# Patient Record
Sex: Male | Born: 1975 | Hispanic: No | Marital: Married | Smoking: Former smoker
Health system: Southern US, Community
[De-identification: ages and names within clinical notes are randomized; demographics above are authoritative.]

## PROBLEM LIST (undated history)

## (undated) DIAGNOSIS — I10 Essential (primary) hypertension: Secondary | ICD-10-CM

## (undated) DIAGNOSIS — K219 Gastro-esophageal reflux disease without esophagitis: Secondary | ICD-10-CM

## (undated) DIAGNOSIS — T7840XA Allergy, unspecified, initial encounter: Secondary | ICD-10-CM

## (undated) DIAGNOSIS — Z8619 Personal history of other infectious and parasitic diseases: Secondary | ICD-10-CM

## (undated) HISTORY — DX: Essential (primary) hypertension: I10

## (undated) HISTORY — DX: Gastro-esophageal reflux disease without esophagitis: K21.9

## (undated) HISTORY — DX: Personal history of other infectious and parasitic diseases: Z86.19

## (undated) HISTORY — DX: Allergy, unspecified, initial encounter: T78.40XA

## (undated) HISTORY — PX: TONSILLECTOMY AND ADENOIDECTOMY: SHX28

---

## 2000-07-30 ENCOUNTER — Encounter: Admission: RE | Admit: 2000-07-30 | Discharge: 2000-07-30 | Payer: Self-pay | Admitting: Family Medicine

## 2000-07-30 ENCOUNTER — Encounter: Payer: Self-pay | Admitting: Family Medicine

## 2004-12-05 ENCOUNTER — Emergency Department (HOSPITAL_COMMUNITY): Admission: EM | Admit: 2004-12-05 | Discharge: 2004-12-06 | Payer: Self-pay | Admitting: Emergency Medicine

## 2006-11-14 ENCOUNTER — Encounter: Admission: RE | Admit: 2006-11-14 | Discharge: 2006-11-14 | Payer: Self-pay | Admitting: Gastroenterology

## 2009-04-07 ENCOUNTER — Encounter: Admission: RE | Admit: 2009-04-07 | Discharge: 2009-04-07 | Payer: Self-pay | Admitting: Gastroenterology

## 2009-04-11 ENCOUNTER — Ambulatory Visit (HOSPITAL_COMMUNITY): Admission: RE | Admit: 2009-04-11 | Discharge: 2009-04-11 | Payer: Self-pay | Admitting: Gastroenterology

## 2009-04-13 ENCOUNTER — Encounter: Admission: RE | Admit: 2009-04-13 | Discharge: 2009-04-13 | Payer: Self-pay | Admitting: Gastroenterology

## 2012-03-03 ENCOUNTER — Ambulatory Visit: Payer: Self-pay | Admitting: Internal Medicine

## 2012-03-10 ENCOUNTER — Encounter: Payer: Self-pay | Admitting: Internal Medicine

## 2012-03-10 ENCOUNTER — Ambulatory Visit (INDEPENDENT_AMBULATORY_CARE_PROVIDER_SITE_OTHER): Payer: BC Managed Care – PPO | Admitting: Internal Medicine

## 2012-03-10 VITALS — BP 122/80 | HR 60 | Temp 98.4°F | Ht 72.0 in | Wt 198.0 lb

## 2012-03-10 DIAGNOSIS — K219 Gastro-esophageal reflux disease without esophagitis: Secondary | ICD-10-CM

## 2012-03-10 DIAGNOSIS — Z0000 Encounter for general adult medical examination without abnormal findings: Secondary | ICD-10-CM

## 2012-03-10 DIAGNOSIS — I10 Essential (primary) hypertension: Secondary | ICD-10-CM

## 2012-03-10 DIAGNOSIS — C4491 Basal cell carcinoma of skin, unspecified: Secondary | ICD-10-CM

## 2012-03-10 DIAGNOSIS — T782XXA Anaphylactic shock, unspecified, initial encounter: Secondary | ICD-10-CM

## 2012-03-10 MED ORDER — LISINOPRIL 20 MG PO TABS: 20.0000 mg | ORAL_TABLET | Freq: Every day | ORAL | Status: DC

## 2012-03-10 MED ORDER — PANTOPRAZOLE SODIUM 40 MG PO TBEC: 40.0000 mg | DELAYED_RELEASE_TABLET | Freq: Every day | ORAL | Status: DC

## 2012-03-10 NOTE — Assessment & Plan Note (Signed)
Well-controlled on lisinopril 20 mg. Monitor electrolytes and kidney function. 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 Unclear if ACE inhibitor played  a role. Refer to allergist for further testing.

## 2012-03-10 NOTE — Patient Instructions (Signed)
Please complete the following lab tests within one week (fasting): BMET, CBCD, FLP, LFTs, TSH, High sensitivity CRP - 401.9 Our office will contact you re: referral to allergist

## 2012-03-10 NOTE — Assessment & Plan Note (Signed)
The patient reports anaphylactic reaction to shellfish in November of 2013. He was treated at local emergency room. Patient encouraged to get prescription for EpiPen filled. He was strongly encouraged to avoid shellfish until further testing by allergist.

## 2012-03-10 NOTE — Progress Notes (Signed)
Subjective:    Patient ID: Jonetta Speak, male    DOB: 01-19-1976, 36 y.o.   MRN: [id number]  HPI  36 year old white male with history of hypertension and basal cell carcinoma of the skin to establish. Patient previously followed by Dr. Martha Clan. Patient reports being treated for hypertension starting in 2009. His initial blood pressures were 140s systolic and 100s diastolic. He has been treated with lisinopril 20 mg once daily. He is tolerating this without any adverse effects. He denies any chronic cough or upper throat irritation.  He also has history of intermittent heartburn. He had EGD to 3 years ago. It was negative for H. Pylori. It showed gastritis. He has been taking protonix 40 mg for last 2-3 years. He is interested in getting off proton pump inhibitor.  Patient reports episode of anaphylactic reaction in November. His occurred while he was traveling with his family in [place]. He was at a seafood restaurant and after eating shellfish he had to seek urgent medical care secondary to throat swelling. He was treated and given prescription for EpiPen which he has yet to fill.  He has chronic history of allergic rhinitis but has not been allergic to foods that he is aware of in the past.  Review of Systems  Constitutional: Negative for activity change, appetite change and unexpected weight change.  Eyes: Negative for visual disturbance.  Respiratory: Negative for cough, chest tightness and shortness of breath.   Cardiovascular: Negative for chest pain.  Genitourinary: Negative for difficulty urinating.  Neurological: Negative for headaches.  Gastrointestinal: Negative for abdominal pain, heartburn melena or hematochezia Psych: Negative for depression or anxiety ID: no history of STDs.    Past Medical History  Diagnosis Date  . GERD (gastroesophageal reflux disease)   . History of chickenpox   . Allergy   . Hypertension     History   Social History  . Marital  Status: Married    Spouse Name: N/A    Number of Children: N/A  . Years of Education: N/A   Occupational History  . Not on file.   Social History Main Topics  . Smoking status: Former Games developer  . Smokeless tobacco: Not on file  . Alcohol Use: Yes  . Drug Use: No  . Sexually Active: Not on file   Other Topics Concern  . Not on file   Social History Narrative  . No narrative on file    Past Surgical History  Procedure Date  . Tonsillectomy and adenoidectomy     Family History  Problem Relation Age of Onset  . Hypertension Mother   . Cancer Maternal Aunt     great aunt had kidney cancer  . Cancer Paternal Uncle     great uncle had prostate cancer  . Hypertension Maternal Grandmother   . Diabetes Maternal Grandmother   . Cancer Maternal Grandmother     kidney  . Heart disease Paternal Grandfather     No Known Allergies  Current Outpatient Prescriptions on File Prior to Visit  Medication Sig Dispense Refill  . cetirizine (ZYRTEC) 10 MG tablet Take 10 mg by mouth daily.      Marland Kitchen lisinopril (PRINIVIL,ZESTRIL) 20 MG tablet Take 1 tablet (20 mg total) by mouth daily.  90 tablet  1  . pantoprazole (PROTONIX) 40 MG tablet Take 1 tablet (40 mg total) by mouth daily.  90 tablet  1    BP 122/80  Pulse 60  Temp 98.4 F (36.9 C) (Oral)  Ht 6' (1.829 m)  Wt 198 lb (89.812 kg)  BMI 26.85 kg/m2          Objective:   Physical Exam  Constitutional: He is oriented to person, place, and time. He appears well-developed and well-nourished. No distress.  HENT:  Head: Normocephalic and atraumatic.  Right Ear: External ear normal.  Left Ear: External ear normal.  Neck: Neck supple.  Cardiovascular: Normal rate, regular rhythm and normal heart sounds.   No murmur heard. Pulmonary/Chest: Effort normal and breath sounds normal. He has no wheezes.  Abdominal: Soft. Bowel sounds are normal. There is no rebound.  Genitourinary: Penis normal.       Normal testicular exam   Musculoskeletal: He exhibits no edema.  Lymphadenopathy:    He has no cervical adenopathy.  Neurological: He is alert and oriented to person, place, and time. No cranial nerve deficit.  Skin: Skin is warm and dry. No rash noted.  Psychiatric: He has a normal mood and affect. His behavior is normal.          Assessment & Plan:

## 2012-03-10 NOTE — Assessment & Plan Note (Signed)
Reviewed adult health maintenance protocols.  Patient is up-to-date with adult vaccines. His blood pressure is well-controlled on lisinopril. Obtain screening lipid panel. Patient follows up with his dermatologist every 6 months due to his history of basal cell carcinoma.

## 2012-03-10 NOTE — Assessment & Plan Note (Signed)
Patient has history of chronic GERD. He had EGD to 3 years ago. It showed gastritis. It was negative for H. Pylori. He would like to try get off protonix if possible. Patient advised to try ranitidine 150 mg twice daily for 1 to 2 weeks and then take as needed. If reflux symptoms recur patient understands he'll need to restart proton pump inhibitor.  Antireflux measures reviewed.

## 2012-03-12 ENCOUNTER — Other Ambulatory Visit (INDEPENDENT_AMBULATORY_CARE_PROVIDER_SITE_OTHER): Payer: BC Managed Care – PPO

## 2012-03-12 DIAGNOSIS — Z0000 Encounter for general adult medical examination without abnormal findings: Secondary | ICD-10-CM

## 2012-03-12 LAB — BASIC METABOLIC PANEL
CO2: 25 mEq/L (ref 19–32)
Chloride: 99 mEq/L (ref 96–112)
Potassium: 4.9 mEq/L (ref 3.5–5.1)
Sodium: 133 mEq/L — ABNORMAL LOW (ref 135–145)

## 2012-03-12 LAB — LIPID PANEL
Cholesterol: 180 mg/dL (ref 0–200)
LDL Cholesterol: 117 mg/dL — ABNORMAL HIGH (ref 0–99)
Total CHOL/HDL Ratio: 3

## 2012-03-12 LAB — CBC WITH DIFFERENTIAL/PLATELET
Basophils Relative: 0.4 % (ref 0.0–3.0)
Eosinophils Absolute: 0.1 10*3/uL (ref 0.0–0.7)
Hemoglobin: 15.2 g/dL (ref 13.0–17.0)
MCHC: 34.5 g/dL (ref 30.0–36.0)
MCV: 88.8 fl (ref 78.0–100.0)
Monocytes Absolute: 0.7 10*3/uL (ref 0.1–1.0)
Neutro Abs: 3.4 10*3/uL (ref 1.4–7.7)
RBC: 4.94 Mil/uL (ref 4.22–5.81)

## 2012-03-12 LAB — HEPATIC FUNCTION PANEL
ALT: 38 U/L (ref 0–53)
Albumin: 4.8 g/dL (ref 3.5–5.2)
Alkaline Phosphatase: 60 U/L (ref 39–117)
Total Protein: 8.2 g/dL (ref 6.0–8.3)

## 2012-03-12 LAB — POCT URINALYSIS DIPSTICK
Protein, UA: NEGATIVE
Spec Grav, UA: 1.01
Urobilinogen, UA: 0.2
pH, UA: 7

## 2012-04-17 ENCOUNTER — Telehealth: Payer: Self-pay | Admitting: Internal Medicine

## 2012-04-17 NOTE — Telephone Encounter (Signed)
Awaiting callback from pt.  

## 2012-08-28 ENCOUNTER — Other Ambulatory Visit: Payer: Self-pay | Admitting: Internal Medicine

## 2012-09-08 ENCOUNTER — Ambulatory Visit: Payer: BC Managed Care – PPO | Admitting: Internal Medicine

## 2012-09-08 DIAGNOSIS — Z0289 Encounter for other administrative examinations: Secondary | ICD-10-CM

## 2012-09-21 ENCOUNTER — Other Ambulatory Visit: Payer: Self-pay | Admitting: Internal Medicine

## 2012-11-24 ENCOUNTER — Other Ambulatory Visit: Payer: Self-pay | Admitting: Internal Medicine

## 2012-12-15 ENCOUNTER — Other Ambulatory Visit: Payer: Self-pay | Admitting: Internal Medicine

## 2013-02-22 ENCOUNTER — Other Ambulatory Visit: Payer: Self-pay | Admitting: Internal Medicine

## 2013-03-23 ENCOUNTER — Telehealth: Payer: Self-pay | Admitting: Internal Medicine

## 2013-03-23 NOTE — Telephone Encounter (Signed)
Dr Artist Pais is not in the office this week.  Pt will need to be seen by another physician

## 2013-03-23 NOTE — Telephone Encounter (Signed)
Pt decline appt, pt stated if he develop symptoms he will call.

## 2013-03-23 NOTE — Telephone Encounter (Signed)
Pt wife has been confirm with flu her doctor is at [facility]. Pt is having cough and chest congestion and requesting tami-flu call into [facility] [address]. Pt last seen dec 2013.

## 2013-04-08 ENCOUNTER — Ambulatory Visit (INDEPENDENT_AMBULATORY_CARE_PROVIDER_SITE_OTHER): Payer: BC Managed Care – PPO | Admitting: Internal Medicine

## 2013-04-08 ENCOUNTER — Encounter: Payer: Self-pay | Admitting: Internal Medicine

## 2013-04-08 DIAGNOSIS — A879 Viral meningitis, unspecified: Secondary | ICD-10-CM

## 2013-04-08 DIAGNOSIS — G030 Nonpyogenic meningitis: Secondary | ICD-10-CM

## 2013-04-08 NOTE — Progress Notes (Signed)
Subjective:    Patient ID: Daniel Mata, male    DOB: 11/06/1975, 38 y.o.   MRN: [id number]  HPI Daniel Mata woke up Saturday with a sore neck, weakness, dizziness. Mild chest pain - sharp not crushing. He has continued nausea, neck pain - stiff with HA global. Mild paresthesia, no photophobia, + nausea w/o vomiting. No cough but mild congestions. Has swollen gland right and "canker sore" gum. Wife did have confirmed influenza but did not take tamiflu.  Past Medical History  Diagnosis Date  . GERD (gastroesophageal reflux disease)   . History of chickenpox   . Allergy   . Hypertension    Past Surgical History  Procedure Laterality Date  . Tonsillectomy and adenoidectomy     Family History  Problem Relation Age of Onset  . Hypertension Mother   . Cancer Maternal Aunt     great aunt had kidney cancer  . Cancer Paternal Uncle     great uncle had prostate cancer  . Hypertension Maternal Grandmother   . Diabetes Maternal Grandmother   . Cancer Maternal Grandmother     kidney  . Heart disease Paternal Grandfather    History   Social History  . Marital Status: Married    Spouse Name: N/A    Number of Children: N/A  . Years of Education: N/A   Occupational History  . Not on file.   Social History Main Topics  . Smoking status: Former Research scientist (life sciences)  . Smokeless tobacco: Not on file  . Alcohol Use: Yes  . Drug Use: No  . Sexual Activity: Not on file   Other Topics Concern  . Not on file   Social History Narrative  . No narrative on file    Current Outpatient Prescriptions on File Prior to Visit  Medication Sig Dispense Refill  . cetirizine (ZYRTEC) 10 MG tablet Take 10 mg by mouth daily.      Marland Kitchen lisinopril (PRINIVIL,ZESTRIL) 20 MG tablet TAKE 1 TABLET (20 MG TOTAL) BY MOUTH DAILY.  90 tablet  0  . pantoprazole (PROTONIX) 40 MG tablet TAKE 1 TABLET (40 MG TOTAL) BY MOUTH DAILY.  60 tablet  3   No current facility-administered medications on file prior to visit.      Review of Systems System review is negative for any constitutional, cardiac, pulmonary, GI or neuro symptoms or complaints other than as described in the HPI.     Objective:   Physical Exam T 98.2, 140/100, HR 58, O2 sat 97%  Wt 201 Gen';l- WNWD man in no distress HEENT - enlarged left pupil, normal reaction to light, throat - apthous ulcer right tonsilar fossa otherwise normal Neck - mild stiffness with flexion, negative Kernig's Cor- RRR pulm - CTAP Neuro - A&O x 3, speech clear, cognition normal; CN II-XII - normal facial symmetry and movement, anisicoria with dilated left pupil that is normally reactive, no visual field cut; normal strength no focal changes; DTRs 2+ symmetrical; MS 5/5 equal throughout; cerebellar - negative for pronator drift, normal Rhomberg, normal gait and tandem gait.       Assessment & Plan:  Aseptic vs viral meningitis - very mild. Can result from any viral infection. No signs of bacterial infection. Neurologic exam is normal except for enlarged left pupil.   Plan  Tylenol 1,000 mg three times a day for pain or fever  Hydrate  For unrelieved headache - aleve or ibuprofen  Watch for increased fever, progressive pain in the neck or stiffness, increased  HA, loss of focus and concentration that is getting worse, projectile vomiting.

## 2013-04-08 NOTE — Progress Notes (Signed)
Pre visit review using our clinic review tool, if applicable. No additional management support is needed unless otherwise documented below in the visit note. 

## 2013-04-08 NOTE — Patient Instructions (Signed)
Aseptic vs viral meningitis - very mild. Can result from any viral infection. No signs of bacterial infection. Neurologic exam is normal except for enlarged left pupil.   Plan  Tylenol 1,000 mg three times a day for pain or fever  Hydrate  For unrelieved headache - aleve or ibuprofen  Watch for increased fever, progressive pain in the neck or stiffness, increased HA, loss of focus and concentration that is getting worse, projectile vomiting.   Viral Meningitis Meningitis is an inflammation of the fluid and membranes surrounding the spinal cord and brain. Viral meningitis is caused by a viral infection. It is important to know whether a virus or bacterium is causing your meningitis. Viral meningitis is generally less severe than bacterial meningitis. Aseptic meningitis is any meningitis not caused by a bacterial infection, including viral meningitis. Meningitis can also be caused by fungi, parasites, certain medicines, cancer, and autoimmune disorders. Uncomplicated meningitis usually resolves on its own in 7 to 10 days.However, there can be complicated cases that last much longer. People with lowered immune systems are at greater risk for poor outcomes from meningitis.It is important to get treatment as soon as possible to minimize the impact of a meningitis infection. Long-term complications could include seizures, hearing loss, weakness, paralysis, blindness, or cognitive impairment. CAUSES Most cases of meningitis are due to viruses. Viruses that cause meningitis include enteroviruses (such as polio and coxsackie viruses), herpes simplex virus, varicella zoster virus, mumps, and HIV. SYMPTOMS  Symptoms can develop over many hours. They may even take a few days to develop. Common symptoms of meningitis in people over the age of 2 years include:  High fever.  Headache.  Stiff neck.  Irritability.  Nausea and vomiting.  Fatigue.  Discomfort from exposure to light.  Discomfort from  exposure to loud noise.  Trouble walking.  Altered mental status.  Seizures. DIAGNOSIS  Early diagnosis and treatment are very important. If you have symptoms, you should see your caregiver right away. Your evaluation may include lab tests of your blood and spinal fluid. A spinal fluid sample is taken through a procedure called a lumbar puncture or spinal tap. During the procedure, a needle is inserted into an area in the lower back. You may also have a CT scan of your brain as part of your evaluation.If there is suspicion of an infection or inflammation of the brain (encephalitis), an MRI scan may be done. TREATMENT   Antibiotics are not effective against a virus. Antiviral medicines may be used depending on the cause of your meningitis.  Treatment for viral meningitis focuses on reducing your symptoms. This may include rest, hydration, and medicines to reduce fever and pain.  Steroids may also be used if there is significant swelling of the brain. PREVENTION  Vaccines can help prevent meningitis caused by polio, measles, and mumps.  Strict hand washing is effective in controlling the spread of many causes of meningitis.  Using barrier protection during sexual intercourse can prevent the spread of meningitis caused by viruses such as the herpes virus or HIV.  Protection from mosquitoes, especially for the very young and very old, can prevent some causes of meningitis. HOME CARE INSTRUCTIONS  Rest.  Drink enough water and fluids to keep your urine clear or pale yellow.  Take all medicine as prescribed.  Practice good hygiene to prevent others from getting sick.  Follow up with your caregiver as directed. SEEK IMMEDIATE MEDICAL CARE IF:  You develop dizziness.  You have a very fast heartbeat.  You have difficulty breathing.  You develop confusion.  You have seizures.  You have unstoppable nausea and vomiting.  You develop any worsening symptoms. MAKE SURE  YOU:  Understand these instructions.  Will watch your condition.  Will get help right away if you are not doing well or get worse. Document Released: 03/08/2002 Document Revised: 06/10/2011 Document Reviewed: 07/03/2010 [facility] Patient Information 2014 [place].

## 2013-04-09 ENCOUNTER — Ambulatory Visit: Payer: BC Managed Care – PPO

## 2013-04-12 ENCOUNTER — Telehealth: Payer: Self-pay | Admitting: *Deleted

## 2013-04-12 NOTE — Telephone Encounter (Signed)
Per Dr Shawna Orleans he could see pt on Wednesday but if he develops a high fever pt needs to go to ER for a lumbar puncture to r/o bacterial meningitis.  Pt is going to be out of town on Wednesday so he wanted to see Dr Linda Hedges tomorrow.  Dr Linda Hedges is booked up tomorrow.  So he is ok with seeing Dr Raliegh Ip at 11 tomorrow 04/13/13.  Pt understands that he needs to go to the ER if he develops high fever.

## 2013-04-12 NOTE — Telephone Encounter (Signed)
Pt calling to state he was referred to see PCP, if not feeling better.  Diagnosed with aseptic meningitis on 04/08/13 and not feeling better.  Would like to know if he can be worked in with PCP ASAP or if PCP can recommend treatment options since he is no better.

## 2013-04-12 NOTE — Telephone Encounter (Signed)
If he is seeing continued improvement and reduction in symptoms he doesn't need to be seen. If there is no improvement he will need OV.

## 2013-04-12 NOTE — Telephone Encounter (Signed)
Detailed message left on patient's voicemail. The voicemail did state his name.

## 2013-04-12 NOTE — Telephone Encounter (Signed)
Pat phoned, saw you on the 8th with a dx of aseptic meningitis.  States that he's 10 days out with sxs and remains symptomatic.  Does he need to come in for f/u or continue to wait it out.  Please advise.  Felt well enough to attempt to go to work (at work at this time.)   CB# [phone]

## 2013-04-13 ENCOUNTER — Encounter: Payer: Self-pay | Admitting: Internal Medicine

## 2013-04-13 ENCOUNTER — Ambulatory Visit (INDEPENDENT_AMBULATORY_CARE_PROVIDER_SITE_OTHER): Payer: BC Managed Care – PPO | Admitting: Internal Medicine

## 2013-04-13 VITALS — BP 130/90 | HR 63 | Temp 98.0°F | Resp 18 | Ht 72.0 in | Wt 204.0 lb

## 2013-04-13 DIAGNOSIS — B9789 Other viral agents as the cause of diseases classified elsewhere: Secondary | ICD-10-CM

## 2013-04-13 DIAGNOSIS — B349 Viral infection, unspecified: Secondary | ICD-10-CM

## 2013-04-13 NOTE — Patient Instructions (Signed)
Call or return to clinic prn if these symptoms worsen or fail to improve as anticipated.

## 2013-04-13 NOTE — Progress Notes (Signed)
Subjective:    Patient ID: Daniel Mata, male    DOB: 1975-10-12, 38 y.o.   MRN: [id number]  HPI  38 year old patient who is seen today in followup. The patient has been ill since January 3 with mild dizziness neck discomfort and general sense of unwellness. He has had some mild dizziness and has felt generally unwell. He was seen last week and felt to have mild aseptic meningitis. Over the past 5 days he has improved slightly but still with some fatigue and mild dizziness. He has taken no medications  Past Medical History  Diagnosis Date  . GERD (gastroesophageal reflux disease)   . History of chickenpox   . Allergy   . Hypertension     History   Social History  . Marital Status: Married    Spouse Name: N/A    Number of Children: N/A  . Years of Education: N/A   Occupational History  . Not on file.   Social History Main Topics  . Smoking status: Former Research scientist (life sciences)  . Smokeless tobacco: Not on file  . Alcohol Use: Yes  . Drug Use: No  . Sexual Activity: Not on file   Other Topics Concern  . Not on file   Social History Narrative  . No narrative on file    Past Surgical History  Procedure Laterality Date  . Tonsillectomy and adenoidectomy      Family History  Problem Relation Age of Onset  . Hypertension Mother   . Cancer Maternal Aunt     great aunt had kidney cancer  . Cancer Paternal Uncle     great uncle had prostate cancer  . Hypertension Maternal Grandmother   . Diabetes Maternal Grandmother   . Cancer Maternal Grandmother     kidney  . Heart disease Paternal Grandfather     Allergies  Allergen Reactions  . Histamine     Related to spice that may have been in the crab boil    Current Outpatient Prescriptions on File Prior to Visit  Medication Sig Dispense Refill  . cetirizine (ZYRTEC) 10 MG tablet Take 10 mg by mouth daily.      Marland Kitchen lisinopril (PRINIVIL,ZESTRIL) 20 MG tablet TAKE 1 TABLET (20 MG TOTAL) BY MOUTH DAILY.  90 tablet  0  . pantoprazole  (PROTONIX) 40 MG tablet TAKE 1 TABLET (40 MG TOTAL) BY MOUTH DAILY.  60 tablet  3   No current facility-administered medications on file prior to visit.    BP 130/90  Pulse 63  Temp(Src) 98 F (36.7 C) (Oral)  Resp 18  Ht 6' (1.829 m)  Wt 204 lb (92.534 kg)  BMI 27.66 kg/m2  SpO2 97%       Review of Systems  Constitutional: Positive for activity change and fatigue. Negative for fever, chills and appetite change.  HENT: Negative for congestion, dental problem, ear pain, hearing loss, sore throat, tinnitus, trouble swallowing and voice change.   Eyes: Negative for pain, discharge and visual disturbance.  Respiratory: Negative for cough, chest tightness, wheezing and stridor.   Cardiovascular: Negative for chest pain, palpitations and leg swelling.  Gastrointestinal: Negative for nausea, vomiting, abdominal pain, diarrhea, constipation, blood in stool and abdominal distention.  Genitourinary: Negative for urgency, hematuria, flank pain, discharge, difficulty urinating and genital sores.  Musculoskeletal: Negative for arthralgias, back pain, gait problem, joint swelling, myalgias and neck stiffness.  Skin: Negative for rash.  Neurological: Positive for light-headedness. Negative for dizziness, syncope, speech difficulty, weakness, numbness and headaches.  Hematological: Negative for adenopathy. Does not bruise/bleed easily.  Psychiatric/Behavioral: Negative for behavioral problems and dysphoric mood. The patient is not nervous/anxious.        Objective:   Physical Exam  Constitutional: He is oriented to person, place, and time. He appears well-developed.  HENT:  Head: Normocephalic.  Right Ear: External ear normal.  Left Ear: External ear normal.  Aphthous ulceration has resolved  Eyes: Conjunctivae and EOM are normal. Pupils are equal, round, and reactive to light.  Pupillary Responses are normal. Mid position and briskly reactive. No anisocoria  Neck: Normal range of  motion.  Cervical adenopathy has resolved  No meningismus  Cardiovascular: Normal rate and normal heart sounds.   Pulmonary/Chest: Breath sounds normal.  Abdominal: Bowel sounds are normal.  Musculoskeletal: Normal range of motion. He exhibits no edema and no tenderness.  Neurological: He is alert and oriented to person, place, and time.  Psychiatric: He has a normal mood and affect. His behavior is normal.          Assessment & Plan:   Probable mild viral syndrome.  Clinical exam is unremarkable without meningismus. Will treat symptomatically and observe

## 2013-04-13 NOTE — Progress Notes (Signed)
Pre-visit discussion using our clinic review tool. No additional management support is needed unless otherwise documented below in the visit note.  

## 2013-05-07 ENCOUNTER — Other Ambulatory Visit: Payer: BC Managed Care – PPO

## 2013-05-12 ENCOUNTER — Telehealth: Payer: Self-pay | Admitting: Internal Medicine

## 2013-05-12 ENCOUNTER — Other Ambulatory Visit: Payer: BC Managed Care – PPO

## 2013-05-12 NOTE — Telephone Encounter (Signed)
Pt states he has not felt well since Jan 1. would like to see dr Daniel Mata asap.  No appt this week. Pt would like to be worked in, or should he see someone else. Pt prefers dr Daniel Mata. pls advise. Pt has had several issues lately, seen by dr Linda Hedges and dr Raliegh Ip

## 2013-05-12 NOTE — Telephone Encounter (Signed)
Pt following up on appt request. pls advise.

## 2013-05-13 NOTE — Telephone Encounter (Addendum)
Pt will be here at 4:15 pm on Friday!  (Even though sch states 1pm) Thanks!!

## 2013-05-13 NOTE — Telephone Encounter (Signed)
Try to work him in either early or late afternoon Friday.

## 2013-05-14 ENCOUNTER — Ambulatory Visit (INDEPENDENT_AMBULATORY_CARE_PROVIDER_SITE_OTHER): Payer: BC Managed Care – PPO | Admitting: Internal Medicine

## 2013-05-14 ENCOUNTER — Ambulatory Visit: Payer: BC Managed Care – PPO | Admitting: Internal Medicine

## 2013-05-14 ENCOUNTER — Encounter: Payer: BC Managed Care – PPO | Admitting: Internal Medicine

## 2013-05-14 ENCOUNTER — Encounter: Payer: Self-pay | Admitting: Internal Medicine

## 2013-05-14 VITALS — BP 132/90 | Temp 97.9°F | Ht 72.0 in | Wt 202.0 lb

## 2013-05-14 DIAGNOSIS — B349 Viral infection, unspecified: Secondary | ICD-10-CM

## 2013-05-14 DIAGNOSIS — R079 Chest pain, unspecified: Secondary | ICD-10-CM

## 2013-05-14 DIAGNOSIS — B9789 Other viral agents as the cause of diseases classified elsewhere: Secondary | ICD-10-CM

## 2013-05-14 DIAGNOSIS — R519 Headache, unspecified: Secondary | ICD-10-CM | POA: Insufficient documentation

## 2013-05-14 DIAGNOSIS — R5381 Other malaise: Secondary | ICD-10-CM

## 2013-05-14 DIAGNOSIS — R5383 Other fatigue: Secondary | ICD-10-CM

## 2013-05-14 DIAGNOSIS — R51 Headache: Secondary | ICD-10-CM

## 2013-05-14 DIAGNOSIS — I10 Essential (primary) hypertension: Secondary | ICD-10-CM

## 2013-05-14 MED ORDER — VALSARTAN 160 MG PO TABS: 160.0000 mg | ORAL_TABLET | Freq: Every day | ORAL | Status: DC

## 2013-05-14 MED ORDER — METHOCARBAMOL 500 MG PO TABS: 500.0000 mg | ORAL_TABLET | Freq: Three times a day (TID) | ORAL | Status: DC | PRN

## 2013-05-14 NOTE — Assessment & Plan Note (Addendum)
He has questionable hx of allergy to shrimp.  Discontinue ACE inhibitor. Switch to valsartan 160 mg once daily.  Patient advised to monitor BP at home.

## 2013-05-14 NOTE — Assessment & Plan Note (Signed)
Patient experiencing persistent headache that is localized to his occipital area. I doubt his persistent headache related to his initial viral illness. He does not have neck stiffness on exam. His MRI and MRA of brain were unremarkable.  MRI of C-spine also normal. I suspect his persistent discomfort secondary to somatic dysfunction of upper cervical vertebrae. Utilized myofascial release and HVLA techniques to thoracic and cervical spine.  Patient tolerated well without any complications. Patient advised to use ibuprofen 400 mg once daily for one week. Patient also given prescription for Robaxin 500 mg to use 3 times a day as needed.  Patient advised to call office if symptoms persist or worsen.  Reassess in 6 weeks.

## 2013-05-14 NOTE — Progress Notes (Signed)
Pre visit review using our clinic review tool, if applicable. No additional management support is needed unless otherwise documented below in the visit note. 

## 2013-05-14 NOTE — Patient Instructions (Addendum)
Please contact our office if your symptoms do not improve or gets worse. Please complete the following lab tests before your next follow up appointment: BMET - 401.9

## 2013-05-14 NOTE — Progress Notes (Signed)
Subjective:    Patient ID: Daniel Mata, male    DOB: Nov 08, 1975, 38 y.o.   MRN: [id number]  HPI  38 year old white male with history of hypertension for followup. Interval medical history-Patient was seen on 04/08/2013 with complaints of flulike symptoms, sore neck and weakness by Dr. Linda Hedges.  His symptoms were attributed to viral syndrome with possible aseptic meningitis. Patient was then seen on January 13th by Dr. Burnice Logan for persistent neck pain and fatigue.  Patient presumed to have resolving viral syndrome.  Patient reports his symptoms got worse and he was seen at local emergency room in [facility]). Patient had extensive workup including MRI and MRA of brain as well as of cervical spine. Outside medical records reviewed-MRI of C-spine showed mild reversal of normal cervical lordosis otherwise unremarkable study. Patient's MRI and MRA were also negative. He tested negative for influenza A and B. CBC was normal without neutrophilia or neutropenia. Patient's complete metabolic panel is essentially normal. Patient was tested for Lyme disease with ELIZA and Western blot. Patient had one band that was positive on Western blot.  He then reports she was seen by neurologist - Dr. Joretta Bachelor.  She felt her symptoms may have been secondary to tension/migraine and he was given course of prednisone. Patient reports no improvement in symptoms. He did not want to proceed with additional medications for migraine.  Patient reports there are days when he feels normal. When he is symptomatic, by late morning he starts to experience posterior neck pain and sensation of "something sitting on top of his head". There is no associated fever or chills. He denies any photophobia or sonophobia. He denies any nausea.  He also still feels "run down" as if still fighting viral infection.  Patient reports his wife had flulike symptoms around Christmas of 2014. She traveled to [place], [place] for one night  before her symptoms started. He denies any insect bites or unusual food intake.  He denies recent tick exposure. No unusual rash.    Review of Systems  Negative for joint pains, negative for abdominal symptoms, he is sleeping normally, negative for light sensitivity, no change in appetite.   Patient denies exercise intolerance. No unusual shortness of breath, or cough.  Patient reports he had left lateral chest pain when he first became ill in early January. No persistent chest symptoms.  Negative for orthopnea     Past Medical History  Diagnosis Date  . GERD (gastroesophageal reflux disease)   . History of chickenpox   . Allergy   . Hypertension     History   Social History  . Marital Status: Married    Spouse Name: N/A    Number of Children: N/A  . Years of Education: N/A   Occupational History  . Not on file.   Social History Main Topics  . Smoking status: Former Research scientist (life sciences)  . Smokeless tobacco: Not on file  . Alcohol Use: Yes  . Drug Use: No  . Sexual Activity: Not on file   Other Topics Concern  . Not on file   Social History Narrative  . No narrative on file    Past Surgical History  Procedure Laterality Date  . Tonsillectomy and adenoidectomy      Family History  Problem Relation Age of Onset  . Hypertension Mother   . Cancer Maternal Aunt     great aunt had kidney cancer  . Cancer Paternal Uncle     great uncle had prostate cancer  .  Hypertension Maternal Grandmother   . Diabetes Maternal Grandmother   . Cancer Maternal Grandmother     kidney  . Heart disease Paternal Grandfather     Allergies  Allergen Reactions  . Histamine     Related to spice that may have been in the crab boil    Current Outpatient Prescriptions on File Prior to Visit  Medication Sig Dispense Refill  . ALOE PO Take 2 capsules by mouth 2 (two) times daily.      . Ascorbic Acid (VITAMIN C) 1000 MG tablet Take 1,000 mg by mouth daily.      . cetirizine (ZYRTEC) 10 MG tablet  Take 10 mg by mouth daily.      . pantoprazole (PROTONIX) 40 MG tablet TAKE 1 TABLET (40 MG TOTAL) BY MOUTH DAILY.  60 tablet  3   No current facility-administered medications on file prior to visit.    BP 132/90  Temp(Src) 97.9 F (36.6 C) (Oral)  Ht 6' (1.829 m)  Wt 202 lb (91.627 kg)  BMI 27.39 kg/m2  EKG showes sinus bradycardia at 53 beats per minute. He has nonspecific QRS widening. When compared to previous EKG-no significant changes.  Objective:   Physical Exam  Constitutional: He is oriented to person, place, and time. He appears well-developed and well-nourished. No distress.  HENT:  Head: Normocephalic and atraumatic.  Right Ear: External ear normal.  Left Ear: External ear normal.  Mouth/Throat: Oropharynx is clear and moist.  Eyes: EOM are normal. Pupils are equal, round, and reactive to light.  Neck: Neck supple.  Cardiovascular: Normal rate, regular rhythm and normal heart sounds.   Pulmonary/Chest: Effort normal and breath sounds normal. He has no wheezes.  Abdominal: Soft. Bowel sounds are normal. He exhibits no mass. There is no tenderness.  No organomegaly  Musculoskeletal:  No neck stiffness, good ROM.  Mild tenderness near occipital area  Lymphadenopathy:    He has no cervical adenopathy.  Neurological: He is alert and oriented to person, place, and time. He displays normal reflexes. No cranial nerve deficit. He exhibits normal muscle tone. Coordination normal.  Muscle strength is 5 out of 5 throughout  Skin: Skin is warm and dry. No rash noted.  Psychiatric: He has a normal mood and affect. His behavior is normal.          Assessment & Plan:

## 2013-05-14 NOTE — Assessment & Plan Note (Signed)
Patient complains of ongoing fatigue after initial viral illness. I doubt he has Lyme's disease. His Western blot was nonspecific. Check Epstein-Barr and CMV titer.  Check Ehrlichia antibody panel.  Low suspicion for tick borne disease.  He likely has non specific resolving viral syndrome.  I recommended supportive care.

## 2013-05-15 LAB — CMV ABS, IGG+IGM (CYTOMEGALOVIRUS): CYTOMEGALOVIRUS AB-IGG: 6.9 U/mL — AB (ref <0.60)

## 2013-05-15 LAB — EPSTEIN-BARR VIRUS VCA ANTIBODY PANEL
EBV EA IgG: <5 U/mL (ref <9.0)
EBV NA IGG: 374 U/mL — AB (ref <18.0)
EBV VCA IGG: 145 U/mL — AB (ref <18.0)

## 2013-05-17 LAB — EHRLICHIA ANTIBODY PANEL
E chaffeensis (HGE) Ab, IgG: <1:64 {titer}
E chaffeensis (HGE) Ab, IgM: <1:20 {titer}

## 2013-05-18 ENCOUNTER — Other Ambulatory Visit: Payer: Self-pay | Admitting: Internal Medicine

## 2013-06-03 ENCOUNTER — Other Ambulatory Visit: Payer: BC Managed Care – PPO

## 2013-06-07 ENCOUNTER — Encounter: Payer: BC Managed Care – PPO | Admitting: Internal Medicine

## 2013-06-17 ENCOUNTER — Other Ambulatory Visit: Payer: BC Managed Care – PPO

## 2013-06-20 ENCOUNTER — Other Ambulatory Visit: Payer: Self-pay | Admitting: Internal Medicine

## 2013-06-25 ENCOUNTER — Ambulatory Visit: Payer: BC Managed Care – PPO | Admitting: Internal Medicine

## 2013-07-12 ENCOUNTER — Encounter: Payer: Self-pay | Admitting: Internal Medicine

## 2013-07-12 DIAGNOSIS — R5381 Other malaise: Secondary | ICD-10-CM

## 2013-07-12 DIAGNOSIS — R5383 Other fatigue: Principal | ICD-10-CM

## 2013-07-16 ENCOUNTER — Ambulatory Visit: Payer: BC Managed Care – PPO

## 2013-07-16 ENCOUNTER — Other Ambulatory Visit (INDEPENDENT_AMBULATORY_CARE_PROVIDER_SITE_OTHER): Payer: BC Managed Care – PPO

## 2013-07-16 DIAGNOSIS — R5381 Other malaise: Secondary | ICD-10-CM

## 2013-07-16 DIAGNOSIS — R5383 Other fatigue: Principal | ICD-10-CM

## 2013-07-16 LAB — TESTOSTERONE: Testosterone: 258.37 ng/dL — ABNORMAL LOW (ref 350.00–890.00)

## 2013-07-16 LAB — T4, FREE: Free T4: 0.9 ng/dL (ref 0.60–1.60)

## 2013-07-16 LAB — TSH: TSH: 3.18 u[IU]/mL (ref 0.35–5.50)

## 2013-07-16 LAB — VITAMIN B12: Vitamin B-12: 410 pg/mL (ref 211–911)

## 2013-07-19 LAB — HEPATIC FUNCTION PANEL
ALBUMIN: 4.5 g/dL (ref 3.5–5.2)
ALK PHOS: 62 U/L (ref 39–117)
ALT: 32 U/L (ref 0–53)
AST: 36 U/L (ref 0–37)
Bilirubin, Direct: 0.1 mg/dL (ref 0.0–0.3)
Total Bilirubin: 0.7 mg/dL (ref 0.3–1.2)
Total Protein: 7.7 g/dL (ref 6.0–8.3)

## 2013-07-19 LAB — TESTOSTERONE, FREE, TOTAL, SHBG
SEX HORMONE BINDING: 26 nmol/L (ref 13–71)
TESTOSTERONE FREE: 57.1 pg/mL (ref 47.0–244.0)
TESTOSTERONE-% FREE: 2.2 % (ref 1.6–2.9)
TESTOSTERONE: 258 ng/dL — AB (ref 300–890)

## 2013-07-19 LAB — LIPID PANEL
CHOLESTEROL: 171 mg/dL (ref 0–200)
HDL: 54.8 mg/dL (ref >39.00)
LDL Cholesterol: 89 mg/dL (ref 0–99)
Total CHOL/HDL Ratio: 3
Triglycerides: 134 mg/dL (ref 0.0–149.0)
VLDL: 26.8 mg/dL (ref 0.0–40.0)

## 2013-07-19 LAB — T3, FREE: T3, Free: 3.1 pg/mL (ref 2.3–4.2)

## 2013-07-22 LAB — METHYLMALONIC ACID, SERUM: Methylmalonic Acid, Quant: 86 nmol/L — ABNORMAL LOW (ref 87–318)

## 2013-07-28 ENCOUNTER — Encounter: Payer: Self-pay | Admitting: Internal Medicine

## 2013-08-16 ENCOUNTER — Other Ambulatory Visit: Payer: Self-pay | Admitting: Internal Medicine

## 2013-11-09 ENCOUNTER — Encounter: Payer: Self-pay | Admitting: Internal Medicine

## 2013-11-09 DIAGNOSIS — Z0182 Encounter for allergy testing: Secondary | ICD-10-CM

## 2013-11-10 NOTE — Telephone Encounter (Signed)
I can do Food allergy blood tests for common food groups, including alpha-gal for meats. I don't associate neck and back of head discomforts with allergy,  But I can see him if that would help you.

## 2013-11-12 NOTE — Addendum Note (Signed)
Addended by: Townsend Roger D on: 11/12/2013 09:32 AM   Modules accepted: Orders

## 2013-12-14 ENCOUNTER — Telehealth: Payer: Self-pay | Admitting: Internal Medicine

## 2013-12-14 NOTE — Telephone Encounter (Signed)
Dr. Annamaria Boots is pulmonary physician as well as allergist.  If he wants to go to another allergist, I am willing.

## 2013-12-14 NOTE — Telephone Encounter (Signed)
Pt states he thought he is going for food allergy testing, and wonders why he is referred/scheduled w/ pulmonary dr.?

## 2013-12-14 NOTE — Telephone Encounter (Signed)
Pt aware, nothing further needed.  ?

## 2013-12-23 ENCOUNTER — Institutional Professional Consult (permissible substitution): Payer: BC Managed Care – PPO | Admitting: Internal Medicine

## 2014-02-17 ENCOUNTER — Institutional Professional Consult (permissible substitution): Payer: BC Managed Care – PPO | Admitting: Internal Medicine

## 2014-04-14 ENCOUNTER — Emergency Department (HOSPITAL_COMMUNITY)
Admission: EM | Admit: 2014-04-14 | Discharge: 2014-04-15 | Disposition: A | Discharge status: Home / Self Care | Payer: BLUE CROSS/BLUE SHIELD | Attending: Emergency Medicine | Admitting: Emergency Medicine

## 2014-04-14 ENCOUNTER — Encounter (HOSPITAL_COMMUNITY): Payer: Self-pay | Admitting: Emergency Medicine

## 2014-04-14 ENCOUNTER — Emergency Department (HOSPITAL_COMMUNITY): Payer: BLUE CROSS/BLUE SHIELD

## 2014-04-14 DIAGNOSIS — R110 Nausea: Secondary | ICD-10-CM | POA: Diagnosis not present

## 2014-04-14 DIAGNOSIS — Z87891 Personal history of nicotine dependence: Secondary | ICD-10-CM | POA: Insufficient documentation

## 2014-04-14 DIAGNOSIS — Z79899 Other long term (current) drug therapy: Secondary | ICD-10-CM | POA: Insufficient documentation

## 2014-04-14 DIAGNOSIS — R0789 Other chest pain: Secondary | ICD-10-CM | POA: Insufficient documentation

## 2014-04-14 DIAGNOSIS — Z8709 Personal history of other diseases of the respiratory system: Secondary | ICD-10-CM | POA: Diagnosis not present

## 2014-04-14 DIAGNOSIS — Z8719 Personal history of other diseases of the digestive system: Secondary | ICD-10-CM | POA: Diagnosis not present

## 2014-04-14 DIAGNOSIS — R42 Dizziness and giddiness: Secondary | ICD-10-CM | POA: Diagnosis not present

## 2014-04-14 DIAGNOSIS — I10 Essential (primary) hypertension: Secondary | ICD-10-CM | POA: Insufficient documentation

## 2014-04-14 DIAGNOSIS — R079 Chest pain, unspecified: Secondary | ICD-10-CM | POA: Diagnosis present

## 2014-04-14 DIAGNOSIS — Z8619 Personal history of other infectious and parasitic diseases: Secondary | ICD-10-CM | POA: Insufficient documentation

## 2014-04-14 LAB — CBC
HCT: 40.7 % (ref 39.0–52.0)
Hemoglobin: 14.5 g/dL (ref 13.0–17.0)
MCH: 30.7 pg (ref 26.0–34.0)
MCHC: 35.6 g/dL (ref 30.0–36.0)
MCV: 86.2 fL (ref 78.0–100.0)
PLATELETS: 230 10*3/uL (ref 150–400)
RBC: 4.72 MIL/uL (ref 4.22–5.81)
RDW: 11.4 % — AB (ref 11.5–15.5)
WBC: 11.9 10*3/uL — ABNORMAL HIGH (ref 4.0–10.5)

## 2014-04-14 LAB — I-STAT TROPONIN, ED
Troponin i, poc: 0 ng/mL (ref 0.00–0.08)
Troponin i, poc: 0 ng/mL (ref 0.00–0.08)

## 2014-04-14 LAB — BASIC METABOLIC PANEL
Anion gap: 10 (ref 5–15)
BUN: 13 mg/dL (ref 6–23)
CHLORIDE: 105 meq/L (ref 96–112)
CO2: 25 mmol/L (ref 19–32)
CREATININE: 1.02 mg/dL (ref 0.50–1.35)
Calcium: 9.2 mg/dL (ref 8.4–10.5)
GFR calc non Af Amer: >90 mL/min (ref >90)
GLUCOSE: 97 mg/dL (ref 70–99)
Potassium: 4.1 mmol/L (ref 3.5–5.1)
Sodium: 140 mmol/L (ref 135–145)

## 2014-04-14 MED ORDER — ASPIRIN 81 MG PO CHEW: 324.0000 mg | CHEWABLE_TABLET | Freq: Once | ORAL | Status: DC

## 2014-04-14 MED ORDER — ALBUTEROL SULFATE HFA 108 (90 BASE) MCG/ACT IN AERS: 1.0000 | INHALATION_SPRAY | Freq: Four times a day (QID) | RESPIRATORY_TRACT | Status: DC | PRN

## 2014-04-14 NOTE — Discharge Instructions (Signed)
.Your caregiver has diagnosed you as having chest pain that is not specific for one problem, but does not require admission.  You are at low risk for an acute heart condition or other serious illness. Chest pain comes from many different causes.  SEEK IMMEDIATE MEDICAL ATTENTION IF: You have severe chest pain, especially if the pain is crushing or pressure-like and spreads to the arms, back, neck, or jaw, or if you have sweating, nausea (feeling sick to your stomach), or shortness of breath. THIS IS AN EMERGENCY. Don't wait to see if the pain will go away. Get medical help at once. Call 911 or 0 (operator). DO NOT drive yourself to the hospital.  Your chest pain gets worse and does not go away with rest.  You have an attack of chest pain lasting longer than usual, despite rest and treatment with the medications your caregiver has prescribed.  You wake from sleep with chest pain or shortness of breath.  You feel dizzy or faint.  You have chest pain not typical of your usual pain for which you originally saw your caregiver.  Your Work up today was negative. You may have had some reactive airway due to your recent uri. Please use your albuterol prior to working out. Bronchospasm A bronchospasm is a spasm or tightening of the airways going into the lungs. During a bronchospasm breathing becomes more difficult because the airways get smaller. When this happens there can be coughing, a whistling sound when breathing (wheezing), and difficulty breathing. Bronchospasm is often associated with asthma, but not all patients who experience a bronchospasm have asthma. CAUSES  A bronchospasm is caused by inflammation or irritation of the airways. The inflammation or irritation may be triggered by:   Allergies (such as to animals, pollen, food, or mold). Allergens that cause bronchospasm may cause wheezing immediately after exposure or many hours later.   Infection. Viral infections are believed to be the most  common cause of bronchospasm.   Exercise.   Irritants (such as pollution, cigarette smoke, strong odors, aerosol sprays, and paint fumes).   Weather changes. Winds increase molds and pollens in the air. Rain refreshes the air by washing irritants out. Cold air may cause inflammation.   Stress and emotional upset.  SIGNS AND SYMPTOMS   Wheezing.   Excessive nighttime coughing.   Frequent or severe coughing with a simple cold.   Chest tightness.   Shortness of breath.  DIAGNOSIS  Bronchospasm is usually diagnosed through a history and physical exam. Tests, such as chest X-rays, are sometimes done to look for other conditions. TREATMENT   Inhaled medicines can be given to open up your airways and help you breathe. The medicines can be given using either an inhaler or a nebulizer machine.  Corticosteroid medicines may be given for severe bronchospasm, usually when it is associated with asthma. HOME CARE INSTRUCTIONS   Always have a plan prepared for seeking medical care. Know when to call your health care provider and local emergency services (911 in the [place] Know where you can access local emergency care.  Only take medicines as directed by your health care provider.  If you were prescribed an inhaler or nebulizer machine, ask your health care provider to explain how to use it correctly. Always use a spacer with your inhaler if you were given one.  It is necessary to remain calm during an attack. Try to relax and breathe more slowly.  Control your home environment in the following ways:  Change your heating and air conditioning filter at least once a month.   Limit your use of fireplaces and wood stoves.  Do not smoke and do not allow smoking in your home.   Avoid exposure to perfumes and fragrances.   Get rid of pests (such as roaches and mice) and their droppings.   Throw away plants if you see mold on them.   Keep your house clean and dust free.    Replace carpet with wood, tile, or vinyl flooring. Carpet can trap dander and dust.   Use allergy-proof pillows, mattress covers, and box spring covers.   Wash bed sheets and blankets every week in hot water and dry them in a dryer.   Use blankets that are made of polyester or cotton.   Wash hands frequently. SEEK MEDICAL CARE IF:   You have muscle aches.   You have chest pain.   The sputum changes from clear or white to yellow, green, gray, or bloody.   The sputum you cough up gets thicker.   There are problems that may be related to the medicine you are given, such as a rash, itching, swelling, or trouble breathing.  SEEK IMMEDIATE MEDICAL CARE IF:   You have worsening wheezing and coughing even after taking your prescribed medicines.   You have increased difficulty breathing.   You develop severe chest pain. MAKE SURE YOU:   Understand these instructions.  Will watch your condition.  Will get help right away if you are not doing well or get worse. Document Released: 03/21/2003 Document Revised: 03/23/2013 Document Reviewed: 09/07/2012 [facility] Patient Information 2015 [place], [place]. This information is not intended to replace advice given to you by your health care provider. Make sure you discuss any questions you have with your health care provider.

## 2014-04-14 NOTE — ED Notes (Signed)
Per EMS, pt comes from [facility] with c/o mild right side chest pain along with nausea and dizziness. Pt A&OX4, NAD noted. Pt was working out at the gym earlier today and felt right side chest pain. Pt given 2 nitro tabs and 324mg  ASA PO with relief. Pt has h/o hypertension. VSS: BP 135/91, P75, 97% rm air. 12 lead unremarkable.

## 2014-04-14 NOTE — ED Provider Notes (Signed)
The patient is a 39 year old male, he has no significant medical history other than hypertension which is well controlled on his medications. He has a history of several EKGs in the past and was found at an urgent care this evening to have what was found to be thought to be abnormal EKG findings with an episode of chest pain that came on while he was riding a spin bike this morning. The patient states that he has been sick with an upper respiratory infection for the last 2 weeks, this was sore throat, nasal congestion, sinus drainage and a cough. He feels that this is gradually improving and this week started exercising again. He never has trouble with exercise, he never has any anginal pains, he has never been diagnosed with heart disease. 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 He has a normal chest x-ray without acute infiltrates, I will obtain a second troponin to rule out any delta change, will need follow-up with cardiology this week. The patient has a heart score of 1   EKG Interpretation  Date/Time:  Thursday April 14 2014 18:35:00 EST Ventricular Rate:  68 PR Interval:  149 QRS Duration: 102 QT Interval:  373 QTC Calculation: 397 R Axis:   -30 Text Interpretation:  Age not entered, assumed to be  39 years old for purpose of ECG interpretation Sinus rhythm Left axis deviation since last tracing no significant change Abnormal ekg Confirmed by Sabra Heck  MD, Lenis Nettleton ([id number]) on 04/14/2014 6:42:24 PM        Medical screening examination/treatment/procedure(s) were conducted as a shared visit with non-physician practitioner(s) and myself.  I personally evaluated the patient during the encounter.  Clinical Impression:   Final diagnoses:  Chest tightness or pressure         Johnna Acosta, MD 04/16/14 1234

## 2014-04-14 NOTE — ED Provider Notes (Signed)
CSN: [id number]     Arrival date & time 04/14/14  1830 History   First MD Initiated Contact with Patient 04/14/14 1839     Chief Complaint  Patient presents with  . Chest Pain     (Consider location/radiation/quality/duration/timing/severity/associated sxs/prior Treatment) HPI  This is a 39 year old male who presents emergency department for complaint of chest pain. He was sent from an outside urgent care today. The patient states that he is just getting over a upper respiratory infection that began in late December. He said it was very bad, maybe a productive cough. He has not been working out recently. The patient states that today he got on his home exercise bike. About 30 minutes into his exercise regimen, he began having some left-sided chest tightness. He states that his heart rate was about 1:30. At that time. He does not feel winded or have any pain. Patient decided to cease his exercise. The patient states that after that he felt fatigued and also had nausea and lightheadedness for the rest of the day. He denies significant volume loss. He states he drinks half his body weight and ounces of water daily. He has a history of social smoking in college. He denies a history of daily tobacco use, he does have hypertension which is well controlled on Diovan. He denies hypercholesterolemia, family history of early MI. He also denies a history of asthma. He continues to have chest tightness.  Past Medical History  Diagnosis Date  . GERD (gastroesophageal reflux disease)   . History of chickenpox   . Allergy   . Hypertension    Past Surgical History  Procedure Laterality Date  . Tonsillectomy and adenoidectomy     Family History  Problem Relation Age of Onset  . Hypertension Mother   . Cancer Maternal Aunt     great aunt had kidney cancer  . Cancer Paternal Uncle     great uncle had prostate cancer  . Hypertension Maternal Grandmother   . Diabetes Maternal Grandmother   . Cancer  Maternal Grandmother     kidney  . Heart disease Paternal Grandfather    History  Substance Use Topics  . Smoking status: Former Research scientist (life sciences)  . Smokeless tobacco: Not on file  . Alcohol Use: Yes    Review of Systems  Ten systems reviewed and are negative for acute change, except as noted in the HPI.    Allergies  Histamine; Nutmeg oil (myristica oil); Other; and Shrimp  Home Medications   Prior to Admission medications   Medication Sig Start Date End Date Taking? Authorizing Provider  Ascorbic Acid (VITAMIN C) 1000 MG tablet Take 1,000 mg by mouth as needed (for cold).    Yes Historical Provider, MD  ibuprofen (ADVIL,MOTRIN) 200 MG tablet Take 200 mg by mouth every 6 (six) hours as needed.   Yes Historical Provider, MD  OVER THE COUNTER MEDICATION Take by mouth daily. Natural whole food supplements-various kinds   Yes Historical Provider, MD  Silver Nitrate SOLN 2 drops by Does not apply route as needed (for sinus congestion).   Yes Historical Provider, MD  valsartan (DIOVAN) 160 MG tablet Take 1 tablet (160 mg total) by mouth daily. 05/14/13  Yes [name], DO  albuterol (PROVENTIL HFA;VENTOLIN HFA) 108 (90 BASE) MCG/ACT inhaler Inhale 1-2 puffs into the lungs every 6 (six) hours as needed for wheezing or shortness of breath. 04/14/14   Sanjna Haskew, PA-C   BP 128/97 mmHg  Pulse 63  Temp(Src) 98.3 F (  36.8 C) (Oral)  Resp 12  SpO2 98% Physical Exam  Constitutional: He is oriented to person, place, and time. He appears well-developed and well-nourished. No distress.  HENT:  Head: Normocephalic and atraumatic.  Eyes: Conjunctivae are normal. No scleral icterus.  Neck: Normal range of motion. Neck supple.  Cardiovascular: Normal rate, regular rhythm and normal heart sounds.   Pulmonary/Chest: Effort normal and breath sounds normal. No respiratory distress. He has no wheezes. He has no rales. He exhibits no tenderness.  Abdominal: Soft. There is no tenderness.   Musculoskeletal: He exhibits no edema.  Neurological: He is alert and oriented to person, place, and time.  Skin: Skin is warm and dry. He is not diaphoretic.  Psychiatric: His behavior is normal.  Nursing note and vitals reviewed.   ED Course  Procedures (including critical care time) Labs Review Labs Reviewed  CBC - Abnormal; Notable for the following:    WBC 11.9 (*)    RDW 11.4 (*)    All other components within normal limits  BASIC METABOLIC PANEL  I-STAT TROPOININ, ED  I-STAT TROPOININ, ED    Imaging Review No results found.   EKG Interpretation   Date/Time:  Thursday April 14 2014 18:35:00 EST Ventricular Rate:  68 PR Interval:  149 QRS Duration: 102 QT Interval:  373 QTC Calculation: 397 R Axis:   -30 Text Interpretation:  Age not entered, assumed to be  39 years old for  purpose of ECG interpretation Sinus rhythm Left axis deviation since last  tracing no significant change Abnormal ekg Confirmed by MILLER  MD, BRIAN  [phone]) on 04/14/2014 6:42:24 PM      MDM   Final diagnoses:  Chest tightness or pressure    1:10 AM BP 128/97 mmHg  Pulse 63  Temp(Src) 98.3 F (36.8 C) (Oral)  Resp 12  SpO2 98% Patient here with episode of chest tightness. To believe this is related to his recent URI. EKG unremarkable for signs of ischemia. His chest x-ray is negative for abnormality. Risk factors for MI or extremely low, his heart score is 1, making him extremely low risk for major adverse cardiac event. Patient seen in shared visit with Dr. Noemi Chapel. He is safe for discharge. Do not feel that acute coronary syndrome is likely. Is  [name], PA-C 04/18/14 0111  Johnna Acosta, MD 04/18/14 [phone]

## 2014-04-22 ENCOUNTER — Other Ambulatory Visit: Payer: Self-pay

## 2014-04-25 ENCOUNTER — Other Ambulatory Visit: Payer: Self-pay | Admitting: *Deleted

## 2014-04-25 DIAGNOSIS — Z0000 Encounter for general adult medical examination without abnormal findings: Secondary | ICD-10-CM

## 2014-04-26 ENCOUNTER — Other Ambulatory Visit: Payer: Self-pay

## 2014-05-11 ENCOUNTER — Other Ambulatory Visit: Payer: Self-pay | Admitting: Internal Medicine

## 2014-05-23 ENCOUNTER — Ambulatory Visit (INDEPENDENT_AMBULATORY_CARE_PROVIDER_SITE_OTHER): Payer: BLUE CROSS/BLUE SHIELD | Admitting: Internal Medicine

## 2014-05-23 ENCOUNTER — Encounter: Payer: Self-pay | Admitting: Internal Medicine

## 2014-05-23 VITALS — BP 116/84 | HR 52 | Temp 98.2°F | Ht 71.0 in | Wt 197.0 lb

## 2014-05-23 DIAGNOSIS — Z0000 Encounter for general adult medical examination without abnormal findings: Secondary | ICD-10-CM

## 2014-05-23 MED ORDER — VALSARTAN 80 MG PO TABS: 80.0000 mg | ORAL_TABLET | Freq: Every day | ORAL | Status: DC

## 2014-05-23 NOTE — Patient Instructions (Signed)
Taper of omeprazole to OTC ranitidine as directed

## 2014-05-23 NOTE — Progress Notes (Signed)
Pre visit review using our clinic review tool, if applicable. No additional management support is needed unless otherwise documented below in the visit note. 

## 2014-05-23 NOTE — Progress Notes (Signed)
Subjective:    Patient ID: Daniel Mata, male    DOB: 1975-06-05, 39 y.o.   MRN: [id number]  HPI  39 year old white male with history of hypertension and allergic rhinitis for routine physical. Interval medical history-patient reports suffering from probable viral upper respiratory infection between December and January. While engaged in aerobic exercise, patient experienced chest pain/tightness. He was evaluated in urgent care and later referred to cardiologist. His cardiac workup including stress test was unremarkable. He is no longer experiencing chest pain.  Hypertension-he decreased valsartan dose to 80 mg. His home blood pressure readings are normal.  Pertinent social and family history reviewed and updated.  Review of Systems.  Constitutional: Negative for activity change, appetite change and unexpected weight change.  Eyes: Negative for visual disturbance.  Respiratory: Negative for cough, chest tightness and shortness of breath.   Cardiovascular: Negative for chest pain.  Genitourinary: Negative for difficulty urinating.  Neurological: Negative for headaches.  Gastrointestinal: Negative for abdominal pain, heartburn melena or hematochezia Psych: Negative for depression or anxiety Endo:  No polyuria or polydypsia        Past Medical History  Diagnosis Date  . GERD (gastroesophageal reflux disease)   . History of chickenpox   . Allergy   . Hypertension     History   Social History  . Marital Status: Married    Spouse Name: [name]  . Number of Children: 2  . Years of Education: N/A   Occupational History  . Not on file.   Social History Main Topics  . Smoking status: Former Research scientist (life sciences)  . Smokeless tobacco: Not on file  . Alcohol Use: Yes  . Drug Use: No  . Sexual Activity: Not on file   Other Topics Concern  . Not on file   Social History Narrative   Self employed   Two children - ages 13 and 72    Past Surgical History  Procedure Laterality Date  .  Tonsillectomy and adenoidectomy      Family History  Problem Relation Age of Onset  . Hypertension Mother   . Cancer Maternal Aunt     great aunt had kidney cancer  . Cancer Paternal Uncle     great uncle had prostate cancer  . Hypertension Maternal Grandmother   . Diabetes Maternal Grandmother   . Cancer Maternal Grandmother     kidney  . Heart disease Paternal Grandfather     Allergies  Allergen Reactions  . Histamine Swelling    Related to spice that may have been in the crab boil or shrimp.  . Nutmeg Oil (Myristica Oil) Swelling  . Other Other (See Comments)    Any spicy foods or spicy ingredients-Causes migraines  . Shrimp [Shellfish Allergy] Other (See Comments)    migraines    Current Outpatient Prescriptions on File Prior to Visit  Medication Sig Dispense Refill  . albuterol (PROVENTIL HFA;VENTOLIN HFA) 108 (90 BASE) MCG/ACT inhaler Inhale 1-2 puffs into the lungs every 6 (six) hours as needed for wheezing or shortness of breath. 1 Inhaler 0  . Ascorbic Acid (VITAMIN C) 1000 MG tablet Take 1,000 mg by mouth as needed (for cold).     Marland Kitchen OVER THE COUNTER MEDICATION Take by mouth daily. Natural whole food supplements-various kinds     No current facility-administered medications on file prior to visit.    BP 116/84 mmHg  Pulse 52  Temp(Src) 98.2 F (36.8 C) (Oral)  Ht 5\' 11"  (1.803 m)  Wt 197 lb (89.359  kg)  BMI 27.49 kg/m2      Objective:   Physical Exam  Constitutional: Appears well-developed and well-nourished. No distress.  Head: Normocephalic and atraumatic.  Ear:  Right and left ear normal.  TMs clear.  Hearing is grossly normal Mouth/Throat: Oropharynx is clear and moist.  Eyes: Conjunctivae are normal. Pupils are equal, round, and reactive to light.  Neck: Normal range of motion. Neck supple. No thyromegaly present. No carotid bruit Cardiovascular: Normal rate, regular rhythm and normal heart sounds.  Exam reveals no gallop and no friction rub.   No murmur heard. Pulmonary/Chest: Effort normal and breath sounds normal.  No wheezes. No rales.  Abdominal: Soft. Bowel sounds are normal. No mass. There is no tenderness.  Neurological: Alert. No cranial nerve deficit.  Skin: Skin is warm and dry.  Psychiatric: Normal mood and affect. Behavior is normal.       Assessment & Plan:

## 2014-05-23 NOTE — Assessment & Plan Note (Addendum)
Reviewed adult health maintenance protocols.  Patient's lipid panel was within normal limits in 2015. His blood pressure is well controlled. Mild weight loss encouraged.  Recent BMET normal.  Continue Diovan 80 mg once daily.

## 2014-06-08 ENCOUNTER — Encounter: Payer: Self-pay | Admitting: Internal Medicine

## 2014-06-17 ENCOUNTER — Encounter (HOSPITAL_COMMUNITY): Payer: Self-pay | Admitting: *Deleted

## 2014-06-17 ENCOUNTER — Emergency Department (HOSPITAL_COMMUNITY)
Admission: EM | Admit: 2014-06-17 | Discharge: 2014-06-17 | Disposition: A | Discharge status: Home / Self Care | Payer: BLUE CROSS/BLUE SHIELD | Attending: Emergency Medicine | Admitting: Emergency Medicine

## 2014-06-17 ENCOUNTER — Emergency Department (HOSPITAL_COMMUNITY): Payer: BLUE CROSS/BLUE SHIELD

## 2014-06-17 DIAGNOSIS — M25512 Pain in left shoulder: Secondary | ICD-10-CM

## 2014-06-17 DIAGNOSIS — K219 Gastro-esophageal reflux disease without esophagitis: Secondary | ICD-10-CM | POA: Diagnosis not present

## 2014-06-17 DIAGNOSIS — Z8619 Personal history of other infectious and parasitic diseases: Secondary | ICD-10-CM | POA: Insufficient documentation

## 2014-06-17 DIAGNOSIS — S161XXA Strain of muscle, fascia and tendon at neck level, initial encounter: Secondary | ICD-10-CM | POA: Insufficient documentation

## 2014-06-17 DIAGNOSIS — S199XXA Unspecified injury of neck, initial encounter: Secondary | ICD-10-CM | POA: Diagnosis present

## 2014-06-17 DIAGNOSIS — S4992XA Unspecified injury of left shoulder and upper arm, initial encounter: Secondary | ICD-10-CM | POA: Diagnosis not present

## 2014-06-17 DIAGNOSIS — Y998 Other external cause status: Secondary | ICD-10-CM | POA: Insufficient documentation

## 2014-06-17 DIAGNOSIS — S39012A Strain of muscle, fascia and tendon of lower back, initial encounter: Secondary | ICD-10-CM | POA: Insufficient documentation

## 2014-06-17 DIAGNOSIS — S0990XA Unspecified injury of head, initial encounter: Secondary | ICD-10-CM | POA: Diagnosis not present

## 2014-06-17 DIAGNOSIS — Y92410 Unspecified street and highway as the place of occurrence of the external cause: Secondary | ICD-10-CM | POA: Insufficient documentation

## 2014-06-17 DIAGNOSIS — I10 Essential (primary) hypertension: Secondary | ICD-10-CM | POA: Diagnosis not present

## 2014-06-17 DIAGNOSIS — Z79899 Other long term (current) drug therapy: Secondary | ICD-10-CM | POA: Insufficient documentation

## 2014-06-17 DIAGNOSIS — Y9389 Activity, other specified: Secondary | ICD-10-CM | POA: Diagnosis not present

## 2014-06-17 MED ORDER — CYCLOBENZAPRINE HCL 5 MG PO TABS: 5.0000 mg | ORAL_TABLET | Freq: Two times a day (BID) | ORAL | Status: DC | PRN

## 2014-06-17 MED ORDER — HYDROCODONE-ACETAMINOPHEN 5-325 MG PO TABS
1.0000 | ORAL_TABLET | Freq: Once | ORAL | Status: DC
  Filled 2014-06-17: qty 1

## 2014-06-17 MED ORDER — TRAMADOL HCL 50 MG PO TABS: 50.0000 mg | ORAL_TABLET | Freq: Four times a day (QID) | ORAL | Status: DC | PRN

## 2014-06-17 MED ORDER — ACETAMINOPHEN 325 MG PO TABS
650.0000 mg | ORAL_TABLET | Freq: Once | ORAL | Status: AC
  Administered 2014-06-17: 650 mg via ORAL
  Filled 2014-06-17: qty 2

## 2014-06-17 MED ORDER — ONDANSETRON 4 MG PO TBDP
4.0000 mg | ORAL_TABLET | Freq: Once | ORAL | Status: AC
  Administered 2014-06-17: 4 mg via ORAL
  Filled 2014-06-17: qty 1
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
## 2014-06-17 NOTE — Discharge Instructions (Signed)

## 2014-06-17 NOTE — ED Notes (Signed)
C/O neck and back pain. Also c/o nausea. Denies LOC.
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
## 2015-01-27 ENCOUNTER — Ambulatory Visit (INDEPENDENT_AMBULATORY_CARE_PROVIDER_SITE_OTHER): Payer: BLUE CROSS/BLUE SHIELD | Admitting: *Deleted

## 2015-01-27 DIAGNOSIS — Z23 Encounter for immunization: Secondary | ICD-10-CM | POA: Diagnosis not present

## 2015-04-02 ENCOUNTER — Other Ambulatory Visit: Payer: Self-pay | Admitting: Internal Medicine

## 2015-05-15 ENCOUNTER — Encounter: Payer: Self-pay | Admitting: Family Medicine

## 2015-05-15 ENCOUNTER — Ambulatory Visit (INDEPENDENT_AMBULATORY_CARE_PROVIDER_SITE_OTHER): Payer: BLUE CROSS/BLUE SHIELD | Admitting: Family Medicine

## 2015-05-15 VITALS — BP 138/94 | HR 79 | Temp 98.3°F | Wt 199.5 lb

## 2015-05-15 DIAGNOSIS — J111 Influenza due to unidentified influenza virus with other respiratory manifestations: Secondary | ICD-10-CM | POA: Diagnosis not present

## 2015-05-15 DIAGNOSIS — I10 Essential (primary) hypertension: Secondary | ICD-10-CM | POA: Diagnosis not present

## 2015-05-15 LAB — POCT INFLUENZA A/B
INFLUENZA A, POC: POSITIVE — AB
Influenza B, POC: POSITIVE — AB

## 2015-05-15 NOTE — Progress Notes (Signed)
Subjective:    Patient ID: Daniel Mata, male    DOB: 1975/09/04, 40 y.o.   MRN: [id number]  HPI  [name] is a 40 year old male who presents today with generalized body aches for 6 days and rhinitis and nonproductive cough for 2 days. Associated symptoms of ear pressure/pain have been present for the past 2 days. He denies sinus pressure/pain, dental pain, SOB, nausea, vomiting, fever, chills, and sweats. Treatments at home include Tylenol and ibuprofen which have provided moderate benefit. Flu vaccine is UTD. Patient has requested to be screened for flu.   BP is elevated today. Patient reports that he does not remember taking his valsartan last night. He usually takes his dose in the evening. He denies headache, visual changes, palpitations, chest pain, and SOB.  Review of Systems  Constitutional: Positive for fatigue. Negative for fever, chills, activity change and appetite change.  HENT: Positive for ear pain, postnasal drip and rhinorrhea. Negative for congestion and sinus pressure.   Respiratory: Negative for cough, chest tightness and shortness of breath.   Cardiovascular: Negative for chest pain, palpitations and leg swelling.  Gastrointestinal: Negative for nausea, vomiting, diarrhea and constipation.  Genitourinary: Negative for dysuria.  Musculoskeletal: Positive for arthralgias.  Skin: Negative for pallor.  Neurological: Negative for dizziness, light-headedness and headaches.   Past Medical History  Diagnosis Date  . GERD (gastroesophageal reflux disease)   . History of chickenpox   . Allergy   . Hypertension     Social History   Social History  . Marital Status: Married    Spouse Name: [name]  . Number of Children: 2  . Years of Education: N/A   Occupational History  . Not on file.   Social History Main Topics  . Smoking status: Former Research scientist (life sciences)  . Smokeless tobacco: Not on file  . Alcohol Use: Yes  . Drug Use: No  . Sexual Activity: Not on file   Other  Topics Concern  . Not on file   Social History Narrative   Self employed   Two children - ages 31 and 73    Past Surgical History  Procedure Laterality Date  . Tonsillectomy and adenoidectomy      Family History  Problem Relation Age of Onset  . Hypertension Mother   . Cancer Maternal Aunt     great aunt had kidney cancer  . Cancer Paternal Uncle     great uncle had prostate cancer  . Hypertension Maternal Grandmother   . Diabetes Maternal Grandmother   . Cancer Maternal Grandmother     kidney  . Heart disease Paternal Grandfather     Allergies  Allergen Reactions  . Histamine Swelling    Related to spice that may have been in the crab boil or shrimp.  . Nutmeg Oil (Myristica Oil) Swelling  . Other Other (See Comments)    Any spicy foods or spicy ingredients-Causes migraines  . Shrimp [Shellfish Allergy] Other (See Comments)    migraines    Current Outpatient Prescriptions on File Prior to Visit  Medication Sig Dispense Refill  . valsartan (DIOVAN) 80 MG tablet TAKE 1 TABLET (80 MG TOTAL) BY MOUTH DAILY. 90 tablet 2  . albuterol (PROVENTIL HFA;VENTOLIN HFA) 108 (90 BASE) MCG/ACT inhaler Inhale 1-2 puffs into the lungs every 6 (six) hours as needed for wheezing or shortness of breath. (Patient not taking: Reported on 05/15/2015) 1 Inhaler 0  . Ascorbic Acid (VITAMIN C) 1000 MG tablet Take 1,000 mg by mouth  as needed (for cold). Reported on 05/15/2015    . cyclobenzaprine (FLEXERIL) 5 MG tablet Take 1 tablet (5 mg total) by mouth 2 (two) times daily as needed for muscle spasms. (Patient not taking: Reported on 05/15/2015) 10 tablet 0  . omeprazole (PRILOSEC) 20 MG capsule Take 20 mg by mouth daily. Reported on 05/15/2015    . OVER THE COUNTER MEDICATION Take by mouth daily. Reported on 05/15/2015    . traMADol (ULTRAM) 50 MG tablet Take 1 tablet (50 mg total) by mouth every 6 (six) hours as needed. (Patient not taking: Reported on 05/15/2015) 15 tablet 0   No current  facility-administered medications on file prior to visit.    BP 138/94 mmHg  Pulse 79  Temp(Src) 98.3 F (36.8 C) (Oral)  Wt 199 lb 8 oz (90.493 kg)  SpO2 97%     Physical Exam  Constitutional: He is oriented to person, place, and time. He appears well-developed and well-nourished.  HENT:  Right Ear: Tympanic membrane normal.  Left Ear: Tympanic membrane normal.  Nose: Rhinorrhea present. Right sinus exhibits no maxillary sinus tenderness and no frontal sinus tenderness. Left sinus exhibits no maxillary sinus tenderness and no frontal sinus tenderness.  Dullness noted in Left and Right TM. Erythematous nares  Eyes: Pupils are equal, round, and reactive to light.  Cardiovascular: Normal rate, regular rhythm and intact distal pulses.   Pulmonary/Chest: Effort normal and breath sounds normal. He has no wheezes. He has no rales. He exhibits no tenderness.  Abdominal: Soft. Bowel sounds are normal. There is no tenderness.  Lymphadenopathy:    He has no cervical adenopathy.  Neurological: He is alert and oriented to person, place, and time.  Skin: Skin is warm and dry.       Assessment & Plan:  1. Essential hypertension  Advised patient to take valsartan as prescribed. Discussed ways he could remember to take this medication daily such as use of a pill box. Encouraged patient to monitor BP at home and if readings are >140/90, then follow up with PCP for further evaluation of medication management. DASH diet encouraged.  2. Influenza Positive for influenza. Symptoms have been present for 6 days so supportive measures of rest, increasing fluids, drinking enough to keep urine pale yellow or clear, advised. Advised follow up with PCP if symptoms do not improve in 3-4 days, worsen, or fever >101. Also, encouraged patient to follow up for annual wellness exam.

## 2015-05-15 NOTE — Progress Notes (Signed)
Pre visit review using our clinic review tool, if applicable. No additional management support is needed unless otherwise documented below in the visit note. 

## 2015-05-15 NOTE — Patient Instructions (Addendum)
Please continue BP medication as prescribed by your PCP. Monitor BP at home for 2 weeks and if readings are >140/90 while taking medication, please contact PCP for further evaluation and follow up. If your symptoms do not improve in 3-4 days, worsen, or you develop a temp >101, please contact clinic for further evaluation.  Influenza, Adult Influenza ("the flu") is a viral infection of the respiratory tract. It occurs more often in winter months because people spend more time in close contact with one another. Influenza can make you feel very sick. Influenza easily spreads from person to person (contagious). CAUSES  Influenza is caused by a virus that infects the respiratory tract. You can catch the virus by breathing in droplets from an infected person's cough or sneeze. You can also catch the virus by touching something that was recently contaminated with the virus and then touching your mouth, nose, or eyes. RISKS AND COMPLICATIONS You may be at risk for a more severe case of influenza if you smoke cigarettes, have diabetes, have chronic heart disease (such as heart failure) or lung disease (such as asthma), or if you have a weakened immune system. Elderly people and pregnant women are also at risk for more serious infections. The most common problem of influenza is a lung infection (pneumonia). Sometimes, this problem can require emergency medical care and may be life threatening. SIGNS AND SYMPTOMS  Symptoms typically last 4 to 10 days and may include:  Fever.  Chills.  Headache, body aches, and muscle aches.  Sore throat.  Chest discomfort and cough.  Poor appetite.  Weakness or feeling tired.  Dizziness.  Nausea or vomiting. DIAGNOSIS  Diagnosis of influenza is often made based on your history and a physical exam. A nose or throat swab test can be done to confirm the diagnosis. TREATMENT  In mild cases, influenza goes away on its own. Treatment is directed at relieving symptoms.  For more severe cases, your health care provider may prescribe antiviral medicines to shorten the sickness. Antibiotic medicines are not effective because the infection is caused by a virus, not by bacteria. HOME CARE INSTRUCTIONS  Take medicines only as directed by your health care provider.  Use a cool mist humidifier to make breathing easier.  Get plenty of rest until your temperature returns to normal. This usually takes 3 to 4 days.  Drink enough fluid to keep your urine clear or pale yellow.  Cover yourmouth and nosewhen coughing or sneezing,and wash your handswellto prevent thevirusfrom spreading.  Stay homefromwork orschool untilthe fever is gonefor at least 50full day. PREVENTION  An annual influenza vaccination (flu shot) is the best way to avoid getting influenza. An annual flu shot is now routinely recommended for all adults in the [place] IF:  You experiencechest pain, yourcough worsens,or you producemore mucus.  Youhave nausea,vomiting, ordiarrhea.  Your fever returns or gets worse. SEEK IMMEDIATE MEDICAL CARE IF:  You havetrouble breathing, you become short of breath,or your skin ornails becomebluish.  You have severe painor stiffnessin the neck.  You develop a sudden headache, or pain in the face or ear.  You have nausea or vomiting that you cannot control. MAKE SURE YOU:   Understand these instructions.  Will watch your condition.  Will get help right away if you are not doing well or get worse.   This information is not intended to replace advice given to you by your health care provider. Make sure you discuss any questions you have with your  health care provider.   Document Released: 03/15/2000 Document Revised: 04/08/2014 Document Reviewed: 06/17/2011 Elsevier Interactive Patient Education [employer].

## 2015-05-17 ENCOUNTER — Telehealth: Payer: Self-pay | Admitting: Internal Medicine

## 2015-05-17 NOTE — Telephone Encounter (Signed)
[facility] - Client [place] [facility]  Patient Name: Jeffrey Schmelter  DOB: [birth year]    Initial Comment Caller States saw dr. Adelfa Koh tested positive for flu. has not gotten better. chest and head congestion, body aches/pains, fatigued    Nurse Assessment  Nurse: Wayne Sever, RN, Tillie Rung Date/Time (Eastern Time): 05/17/2015 11:29:53 AM  Confirm and document reason for call. If symptomatic, describe symptoms. You must click the next button to save text entered. ---Caller states he tested positive for flu Monday. He has not had a fever. Caller states he feels worse. He has a lot of chest and head congestion and states he is not improving at all. He did have the flu shot also  Has the patient traveled out of the country within the last 30 days? ---Not Applicable  Does the patient have any new or worsening symptoms? ---Yes  Will a triage be completed? ---Yes  Related visit to physician within the last 2 weeks? ---Yes  Does the PT have any chronic conditions? (i.e. diabetes, asthma, etc.) ---Yes  List chronic conditions. ---HTN  Is this a behavioral health or substance abuse call? ---No     Guidelines    Guideline Title Affirmed Question Affirmed Notes  Influenza Follow-up Call [1] Using nasal washes and pain medicine > 24 hours AND [2] sinus pain (around cheekbone or eye) persists    Final Disposition User   See Physician within 24 Hours Senath, RN, Tillie Rung    Comments  Appointment scheduled with Almira Coaster - NP. 02/16 at 900am in [place], caller is aware of appointment   Referrals  REFERRED TO PCP OFFICE   Disagree/Comply: Comply

## 2015-05-17 NOTE — Telephone Encounter (Signed)
Patient has appt scheduled tomorrow with [facility]

## 2015-05-18 ENCOUNTER — Ambulatory Visit (INDEPENDENT_AMBULATORY_CARE_PROVIDER_SITE_OTHER): Payer: BLUE CROSS/BLUE SHIELD | Admitting: Family Medicine

## 2015-05-18 ENCOUNTER — Encounter: Payer: Self-pay | Admitting: Family Medicine

## 2015-05-18 ENCOUNTER — Telehealth: Payer: Self-pay | Admitting: *Deleted

## 2015-05-18 VITALS — BP 130/88 | HR 76 | Temp 98.3°F | Wt 197.0 lb

## 2015-05-18 DIAGNOSIS — J111 Influenza due to unidentified influenza virus with other respiratory manifestations: Secondary | ICD-10-CM | POA: Diagnosis not present

## 2015-05-18 DIAGNOSIS — I10 Essential (primary) hypertension: Secondary | ICD-10-CM | POA: Diagnosis not present

## 2015-05-18 NOTE — Progress Notes (Signed)
Pre visit review using our clinic review tool, if applicable. No additional management support is needed unless otherwise documented below in the visit note. 

## 2015-05-18 NOTE — Telephone Encounter (Signed)
Left message on machine for patient to return our call 

## 2015-05-18 NOTE — Patient Instructions (Addendum)
Please continue supportive measures of rest, increasing fluids, and use of Tylenol as needed for body aches. Mucinex DM can be used for cough as needed. Continue monitoring BP at home for 2 weeks and if you note average BPs >140/90 then follow up with PCP.  Avoid use of ibuprofen and substitute Tylenol for body aches if needed.  Return to clinic for further evaluation if you are not improving in 3-4 days, worsen, or develop a fever >101.

## 2015-05-18 NOTE — Telephone Encounter (Signed)
Confirm pt requesting scopolamine transdermal.  Also make sure he does not have any contraindications such as glaucoma or BPH.  If no contraindication, ok for rx for scopolamine transdermal - disp #3.  Apply q 72 hrs.  Apply >4 hrs before event.  No RFs

## 2015-05-18 NOTE — Telephone Encounter (Signed)
Patient is requesting a patch for travel sickness because he is going on a trip in March. Daniel Mata

## 2015-05-18 NOTE — Progress Notes (Signed)
Subjective:    Patient ID: Daniel Mata, male    DOB: 02/19/1976, 40 y.o.   MRN: [id number]  HPI  Mr. Passero is a 40 year old male who presents today for symptoms of minor body aches, rhinitis, and nonproductive cough. On 05/15/15 he was seen for these symptoms. He tested + for influenza and has been using Tylenol and Ibuprofen for symptoms. He reports feeling worse for the past 2 days, however notes that his symptoms have improved greatly today.  He also reports alternating Tylenol and Ibuprofen for the past 2 days but notes only taking Tylenol this morning at 5:30am.  He denies sinus pressure/pain, dental pain, SOB, nausea, vomiting, fever, chills, and sweats.    BP is 130/88 on retake today. Patient states that he did take his valsartan last night and is beginning to monitor his BP at home on a daily basis. He notes that BP averages 120s/80s at home but has noted it at 130/90 during his recent illness. He denies headache, visual changes, palpitations, chest pain, and SOB   Review of Systems  Constitutional: Negative for fever, chills and fatigue.  HENT: Positive for postnasal drip and rhinorrhea. Negative for congestion, sinus pressure, sneezing and sore throat.   Eyes: Negative for visual disturbance.  Respiratory: Negative for cough, chest tightness, shortness of breath and wheezing.   Cardiovascular: Negative for chest pain and palpitations.  Gastrointestinal: Negative for nausea, vomiting, abdominal pain, diarrhea and constipation.  Musculoskeletal: Negative for myalgias and arthralgias.  Skin: Negative for pallor.  Neurological: Negative for dizziness, light-headedness and headaches.   Past Medical History  Diagnosis Date  . GERD (gastroesophageal reflux disease)   . History of chickenpox   . Allergy   . Hypertension     Social History   Social History  . Marital Status: Married    Spouse Name: [name]  . Number of Children: 2  . Years of Education: N/A   Occupational  History  . Not on file.   Social History Main Topics  . Smoking status: Former Research scientist (life sciences)  . Smokeless tobacco: Not on file  . Alcohol Use: Yes  . Drug Use: No  . Sexual Activity: Not on file   Other Topics Concern  . Not on file   Social History Narrative   Self employed   Two children - ages 39 and 58    Past Surgical History  Procedure Laterality Date  . Tonsillectomy and adenoidectomy      Family History  Problem Relation Age of Onset  . Hypertension Mother   . Cancer Maternal Aunt     great aunt had kidney cancer  . Cancer Paternal Uncle     great uncle had prostate cancer  . Hypertension Maternal Grandmother   . Diabetes Maternal Grandmother   . Cancer Maternal Grandmother     kidney  . Heart disease Paternal Grandfather     Allergies  Allergen Reactions  . Histamine Swelling    Related to spice that may have been in the crab boil or shrimp.  . Nutmeg Oil (Myristica Oil) Swelling  . Other Other (See Comments)    Any spicy foods or spicy ingredients-Causes migraines  . Shrimp [Shellfish Allergy] Other (See Comments)    migraines    Current Outpatient Prescriptions on File Prior to Visit  Medication Sig Dispense Refill  . valsartan (DIOVAN) 80 MG tablet TAKE 1 TABLET (80 MG TOTAL) BY MOUTH DAILY. 90 tablet 2   No current facility-administered medications on  file prior to visit.    BP 130/88 mmHg  Pulse 76  Temp(Src) 98.3 F (36.8 C) (Oral)  Wt 197 lb (89.359 kg)  SpO2 97%   Objective:   Physical Exam  Constitutional: He is oriented to person, place, and time. He appears well-developed and well-nourished.  HENT:  Right Ear: Tympanic membrane normal.  Left Ear: Tympanic membrane normal.  Nose: Right sinus exhibits no maxillary sinus tenderness and no frontal sinus tenderness. Left sinus exhibits no maxillary sinus tenderness and no frontal sinus tenderness.  Mouth/Throat: No posterior oropharyngeal erythema.  Eyes: Pupils are equal, round, and  reactive to light.  Cardiovascular: Normal rate, regular rhythm and normal heart sounds.   Pulmonary/Chest: Effort normal and breath sounds normal. He has no wheezes. He has no rales.  Abdominal: Soft.  Lymphadenopathy:    He has no cervical adenopathy.  Neurological: He is alert and oriented to person, place, and time.  Skin: Skin is warm and dry.  Psychiatric: He has a normal mood and affect.       Assessment & Plan:  1. Influenza  History and exam support resolving symptoms of flu illness. Advised supportive measures of rest, increasing fluids, Mucinex DM if needed. Advised patient to RTC if symptoms do not improve in 3-4 days, worsen, or he develops a fever >101. Tylenol for body aches as needed.    2. Essential hypertension  Advised patient to continue valsartan as prescribed by his PCP. Monitor BP at home for 2 weeks and if BPs at home are >140/90, please follow up with PCP for further evaluation of medication management. Reviewed and encouraged DASH diet with patient.

## 2015-05-19 MED ORDER — SCOPOLAMINE 1 MG/3DAYS TD PT72: MEDICATED_PATCH | TRANSDERMAL | Status: DC

## 2015-05-19 NOTE — Telephone Encounter (Signed)
Spoke with patient and rx sent 

## 2015-05-24 NOTE — Telephone Encounter (Signed)
Pt called back this afternoon to advise he is still has a lot of congestion. Pt states he is not worse, but not better. Pt states it is all in his chest Pt wants to know if he can double up on the musinex. Or do you need to see pt again?  [facility]

## 2015-05-25 NOTE — Telephone Encounter (Signed)
Voicemail was left for pt to return my call   

## 2015-05-25 NOTE — Telephone Encounter (Signed)
Please take Mucinex DM as directed on package. Symptoms can last up to 2 weeks. If congestion continues, symptoms worsen, or fever >101 occurs, please contact clinic for further evaluaton.

## 2015-05-25 NOTE — Telephone Encounter (Signed)
Please advise 

## 2015-05-30 ENCOUNTER — Ambulatory Visit (INDEPENDENT_AMBULATORY_CARE_PROVIDER_SITE_OTHER): Payer: BLUE CROSS/BLUE SHIELD | Admitting: Family Medicine

## 2015-05-30 ENCOUNTER — Encounter: Payer: Self-pay | Admitting: Family Medicine

## 2015-05-30 VITALS — BP 110/82 | HR 93 | Temp 97.9°F | Ht 71.0 in | Wt 196.8 lb

## 2015-05-30 DIAGNOSIS — J189 Pneumonia, unspecified organism: Secondary | ICD-10-CM

## 2015-05-30 DIAGNOSIS — J1100 Influenza due to unidentified influenza virus with unspecified type of pneumonia: Secondary | ICD-10-CM | POA: Diagnosis not present

## 2015-05-30 MED ORDER — DOXYCYCLINE HYCLATE 100 MG PO TABS: 100.0000 mg | ORAL_TABLET | Freq: Two times a day (BID) | ORAL | Status: DC

## 2015-05-30 NOTE — Progress Notes (Signed)
HPI:  Jirah is a 40 yo here for an acute visit for a cough. -started: about 2 weeks ago with confirmed flu treated supportively per review of notes -reports was improving, then cough productive of thick mucus, body aches and fatigue worsened the last few days  -denies:fever, SOB, NVD, tooth pain, wheezing -has tried: musinex -Hx of: allergies  ROS: See pertinent positives and negatives per HPI.  Past Medical History  Diagnosis Date  . GERD (gastroesophageal reflux disease)   . History of chickenpox   . Allergy   . Hypertension     Past Surgical History  Procedure Laterality Date  . Tonsillectomy and adenoidectomy      Family History  Problem Relation Age of Onset  . Hypertension Mother   . Cancer Maternal Aunt     great aunt had kidney cancer  . Cancer Paternal Uncle     great uncle had prostate cancer  . Hypertension Maternal Grandmother   . Diabetes Maternal Grandmother   . Cancer Maternal Grandmother     kidney  . Heart disease Paternal Grandfather     Social History   Social History  . Marital Status: Married    Spouse Name: [name]  . Number of Children: 2  . Years of Education: N/A   Social History Main Topics  . Smoking status: Former Research scientist (life sciences)  . Smokeless tobacco: None  . Alcohol Use: Yes  . Drug Use: No  . Sexual Activity: Not Asked   Other Topics Concern  . None   Social History Narrative   Self employed   Two children - ages 66 and 79     Current outpatient prescriptions:  .  scopolamine (TRANSDERM-SCOP, 1.5 MG,) 1 MG/3DAYS, Apply 4 hours before event and every 72 hours after, Disp: 3 patch, Rfl: 0 .  valsartan (DIOVAN) 80 MG tablet, TAKE 1 TABLET (80 MG TOTAL) BY MOUTH DAILY., Disp: 90 tablet, Rfl: 2 .  doxycycline (VIBRA-TABS) 100 MG tablet, Take 1 tablet (100 mg total) by mouth 2 (two) times daily., Disp: 20 tablet, Rfl: 0  EXAM:  Filed Vitals:   05/30/15 1357  BP: 110/82  Pulse: 93  Temp: 97.9 F (36.6 C)    Body mass index  is 27.46 kg/(m^2).  GENERAL: vitals reviewed and listed above, alert, oriented, appears well hydrated and in no acute distress  HEENT: atraumatic, conjunttiva clear, no obvious abnormalities on inspection of external nose and ears, normal appearance of ear canals and TMs, clear nasal congestion, mild post oropharyngeal erythema with PND, no tonsillar edema or exudate, no sinus TTP  NECK: no obvious masses on inspection  LUNGS: clear to auscultation bilaterally, rhoncherous breath sound R middle lung field, no wheezes, rales or rhonchi otherwise, good air movement; appears comfortable without any signs of respiratory distress  CV: HRRR, no peripheral edema  MS: moves all extremities without noticeable abnormality  PSYCH: pleasant and cooperative, no obvious depression or anxiety  ASSESSMENT AND PLAN:  Discussed the following assessment and plan:  CAP (community acquired pneumonia) - Plan: doxycycline (VIBRA-TABS) 100 MG tablet  Influenza with pneumonia  -We discussed potential etiologies, with influenza associated CAP most likely. He is afebrile with normal vital signs and appears well on exam without and reported SOB or signs of respiratory distress. Advised CXR would help to confirm, but after discussed risks/benefits he opted for empiric treatment with doxycycline. We discussed treatment side effects, likely course, return and emergency precautions. Advised start abx today and follow up in 2 weeks,  sooner or emergency care if worsening or not improving as expected. -of course, we advised to return or notify a doctor immediately if symptoms worsen or persist or new concerns arise.    Patient Instructions  Please start the antibiotic today.  Schedule follow up in 2 weeks.  Seek care immediatly if worsening despite treatment, struggling to breath or other concerns.       Colin Benton R.

## 2015-05-30 NOTE — Progress Notes (Signed)
Pre visit review using our clinic review tool, if applicable. No additional management support is needed unless otherwise documented below in the visit note. 

## 2015-05-30 NOTE — Patient Instructions (Signed)
Please start the antibiotic today.  Schedule follow up in 2 weeks.  Seek care immediatly if worsening despite treatment, struggling to breath or other concerns.

## 2015-06-02 ENCOUNTER — Ambulatory Visit (INDEPENDENT_AMBULATORY_CARE_PROVIDER_SITE_OTHER): Payer: BLUE CROSS/BLUE SHIELD | Admitting: Family Medicine

## 2015-06-02 ENCOUNTER — Telehealth: Payer: Self-pay | Admitting: Internal Medicine

## 2015-06-02 ENCOUNTER — Ambulatory Visit (INDEPENDENT_AMBULATORY_CARE_PROVIDER_SITE_OTHER)
Admission: RE | Admit: 2015-06-02 | Discharge: 2015-06-02 | Disposition: A | Discharge status: Home / Self Care | Payer: BLUE CROSS/BLUE SHIELD | Source: Ambulatory Visit | Attending: Family Medicine | Admitting: Family Medicine

## 2015-06-02 ENCOUNTER — Encounter: Payer: Self-pay | Admitting: Family Medicine

## 2015-06-02 VITALS — BP 110/78 | HR 81 | Temp 97.8°F | Ht 71.0 in | Wt 196.1 lb

## 2015-06-02 DIAGNOSIS — J1100 Influenza due to unidentified influenza virus with unspecified type of pneumonia: Secondary | ICD-10-CM

## 2015-06-02 DIAGNOSIS — J189 Pneumonia, unspecified organism: Secondary | ICD-10-CM

## 2015-06-02 MED ORDER — LEVOFLOXACIN 750 MG PO TABS: 750.0000 mg | ORAL_TABLET | Freq: Every day | ORAL | Status: DC

## 2015-06-02 MED ORDER — PREDNISONE 20 MG PO TABS: 40.0000 mg | ORAL_TABLET | Freq: Every day | ORAL | Status: DC

## 2015-06-02 NOTE — Patient Instructions (Signed)
Please continue the doxycycline and add prednisone.  If not improving some over the next 24-48 hours stop the doxycycline and start the levoquin.  Seek care immediately if worsening, struggling to breath or you have new concerns.  Follow up in 4 weeks or sooner as needed.

## 2015-06-02 NOTE — Telephone Encounter (Signed)
Pt saw you Tues,2/28, and diagnosed with PNA. Pt feels like he should be a little farther along in recovery. Pt only taking the doxycycline (VIBRA-TABS) 100 MG tablet. Not feeling much better. Pt would like to know if there may be something else Dr Maudie Mercury would prescribe.  [facility]

## 2015-06-02 NOTE — Progress Notes (Signed)
Pre visit review using our clinic review tool, if applicable. No additional management support is needed unless otherwise documented below in the visit note. 

## 2015-06-02 NOTE — Telephone Encounter (Signed)
Needs appt. Today. Would advise CXR prior to appt . if possible.

## 2015-06-02 NOTE — Telephone Encounter (Signed)
I called the pt and informed him of the message below and he states he will go now for the x-ray.  Appt with Dr Maudie Mercury scheduled for today at 2:45pm.

## 2015-06-02 NOTE — Progress Notes (Signed)
HPI:  Daniel Mata is a pleasant 40 year old here for a follow-up visit. He recently had the flu, then developed symptoms concerning for a possible mild CAP. His chest x-ray showed possible interstitial pneumonia or pneumonitis; radiology advised a four-week repeat x-ray after antibiotic therapy. He was seen 3 days ago, and was started on doxycycline. He reports he feels he has not improved as much as he expected. He feels slightly better today, but still has a productive cough, fatigue and occasional wheezing sensation in the mornings. Denies body aches, shortness of breath, fevers, hemoptysis or worsening symptoms. He is concerned that the antibiotic is not working. He also wonders of prednisone will be useful.  ROS: See pertinent positives and negatives per HPI.  Past Medical History  Diagnosis Date  . GERD (gastroesophageal reflux disease)   . History of chickenpox   . Allergy   . Hypertension     Past Surgical History  Procedure Laterality Date  . Tonsillectomy and adenoidectomy      Family History  Problem Relation Age of Onset  . Hypertension Mother   . Cancer Maternal Aunt     great aunt had kidney cancer  . Cancer Paternal Uncle     great uncle had prostate cancer  . Hypertension Maternal Grandmother   . Diabetes Maternal Grandmother   . Cancer Maternal Grandmother     kidney  . Heart disease Paternal Grandfather     Social History   Social History  . Marital Status: Married    Spouse Name: [name]  . Number of Children: 2  . Years of Education: N/A   Social History Main Topics  . Smoking status: Former Research scientist (life sciences)  . Smokeless tobacco: None  . Alcohol Use: Yes  . Drug Use: No  . Sexual Activity: Not Asked   Other Topics Concern  . None   Social History Narrative   Self employed   Two children - ages 27 and 13     Current outpatient prescriptions:  .  doxycycline (VIBRA-TABS) 100 MG tablet, Take 1 tablet (100 mg total) by mouth 2 (two) times daily., Disp: 20  tablet, Rfl: 0 .  scopolamine (TRANSDERM-SCOP, 1.5 MG,) 1 MG/3DAYS, Apply 4 hours before event and every 72 hours after, Disp: 3 patch, Rfl: 0 .  valsartan (DIOVAN) 80 MG tablet, TAKE 1 TABLET (80 MG TOTAL) BY MOUTH DAILY., Disp: 90 tablet, Rfl: 2 .  levofloxacin (LEVAQUIN) 750 MG tablet, Take 1 tablet (750 mg total) by mouth daily., Disp: 5 tablet, Rfl: 0 .  predniSONE (DELTASONE) 20 MG tablet, Take 2 tablets (40 mg total) by mouth daily with breakfast., Disp: 8 tablet, Rfl: 0  EXAM:  Filed Vitals:   06/02/15 1430  BP: 110/78  Pulse: 81  Temp: 97.8 F (36.6 C)    Body mass index is 27.36 kg/(m^2).  GENERAL: vitals reviewed and listed above, alert, oriented, appears well hydrated and in no acute distress  HEENT: atraumatic, conjunttiva clear, no obvious abnormalities on inspection of external nose and ears  NECK: no obvious masses on inspection  LUNGS: clear to auscultation bilaterally, no wheezes, rales or rhonchi, good air movement. No signs of respiratory distress. Oxygen saturations normal on room air.  CV: HRRR, no peripheral edema  MS: moves all extremities without noticeable abnormality  PSYCH: pleasant and cooperative, no obvious depression or anxiety  ASSESSMENT AND PLAN:  Discussed the following assessment and plan:  CAP (community acquired pneumonia)  Influenza with pneumonia  -His exam has improved. -We reviewed his  x-ray findings. -We had a lengthy discussion about the treatment options, treatment risks, expected course and the potential complications of likely CAP or pneumonitis  -he seems to be improving on doxycycline -he is nervous as feels he should be much better today -opted to add prednisone and change to levaquin if not continued improvement over the next 24-48 hours -Or course, we advised that he notify a doctor immediately if symptoms worsen or persist or new concerns arise. -follow up in 4 weeks with repeat CXR, advised 1 week follow up, but her  prefers to call if is not doing better rather then to schedule now  Patient Instructions  Please continue the doxycycline and add prednisone.  If not improving some over the next 24-48 hours stop the doxycycline and start the levoquin.  Seek care immediately if worsening, struggling to breath or you have new concerns.  Follow up in 4 weeks or sooner as needed.     Colin Benton R.

## 2015-06-09 ENCOUNTER — Telehealth: Payer: Self-pay | Admitting: Internal Medicine

## 2015-06-09 ENCOUNTER — Encounter: Payer: Self-pay | Admitting: Family Medicine

## 2015-06-09 ENCOUNTER — Ambulatory Visit (INDEPENDENT_AMBULATORY_CARE_PROVIDER_SITE_OTHER): Payer: BLUE CROSS/BLUE SHIELD | Admitting: Family Medicine

## 2015-06-09 VITALS — BP 112/68 | HR 86 | Temp 98.0°F | Ht 71.0 in | Wt 197.2 lb

## 2015-06-09 DIAGNOSIS — M791 Myalgia, unspecified site: Secondary | ICD-10-CM

## 2015-06-09 DIAGNOSIS — J189 Pneumonia, unspecified organism: Secondary | ICD-10-CM | POA: Diagnosis not present

## 2015-06-09 MED ORDER — CYCLOBENZAPRINE HCL 5 MG PO TABS: 5.0000 mg | ORAL_TABLET | Freq: Three times a day (TID) | ORAL | Status: DC | PRN

## 2015-06-09 MED ORDER — TIZANIDINE HCL 2 MG PO TABS: 2.0000 mg | ORAL_TABLET | Freq: Three times a day (TID) | ORAL | Status: DC | PRN

## 2015-06-09 NOTE — Progress Notes (Signed)
Pre visit review using our clinic review tool, if applicable. No additional management support is needed unless otherwise documented below in the visit note. 

## 2015-06-09 NOTE — Progress Notes (Signed)
HPI:  Daniel Mata is a 40 yo recently treated for CAP following confirmed influenza here for and acute visit. He is feeling "much better" in terms of energy, cough and overall, but has developed L sided muscular chest wall pain for the last few days. No pain at rest. Pain with certain movements and coughing - sometime severe. Finishes abx (doxycycline today.) No SOB, fevers, wheezing, malaise. Still occasional cough but this is improving as well. Mucus is mostly clear now.   ROS: See pertinent positives and negatives per HPI.  Past Medical History  Diagnosis Date  . GERD (gastroesophageal reflux disease)   . History of chickenpox   . Allergy   . Hypertension     Past Surgical History  Procedure Laterality Date  . Tonsillectomy and adenoidectomy      Family History  Problem Relation Age of Onset  . Hypertension Mother   . Cancer Maternal Aunt     great aunt had kidney cancer  . Cancer Paternal Uncle     great uncle had prostate cancer  . Hypertension Maternal Grandmother   . Diabetes Maternal Grandmother   . Cancer Maternal Grandmother     kidney  . Heart disease Paternal Grandfather     Social History   Social History  . Marital Status: Married    Spouse Name: Daniel Mata  . Number of Children: 2  . Years of Education: N/A   Social History Main Topics  . Smoking status: Former Research scientist (life sciences)  . Smokeless tobacco: None  . Alcohol Use: Yes  . Drug Use: No  . Sexual Activity: Not Asked   Other Topics Concern  . None   Social History Narrative   Self employed   Two children - ages 43 and 73     Current outpatient prescriptions:  .  scopolamine (TRANSDERM-SCOP, 1.5 MG,) 1 MG/3DAYS, Apply 4 hours before event and every 72 hours after, Disp: 3 patch, Rfl: 0 .  valsartan (DIOVAN) 80 MG tablet, TAKE 1 TABLET (80 MG TOTAL) BY MOUTH DAILY., Disp: 90 tablet, Rfl: 2 .  cyclobenzaprine (FLEXERIL) 5 MG tablet, Take 1 tablet (5 mg total) by mouth 3 (three) times daily as needed  for muscle spasms., Disp: 30 tablet, Rfl: 1  EXAM:  Filed Vitals:   06/09/15 1404  BP: 112/68  Pulse: 86  Temp: 98 F (36.7 C)    Body mass index is 27.52 kg/(m^2).  GENERAL: vitals reviewed and listed above, alert, oriented, appears well hydrated and in no acute distress  HEENT: atraumatic, conjunttiva clear, no obvious abnormalities on inspection of external nose and ears, normal appearance of ear canals and TMs, clear nasal congestion, mild post oropharyngeal erythema with PND, no tonsillar edema or exudate, no sinus TTP  NECK: no obvious masses on inspection  LUNGS: clear to auscultation bilaterally, no wheezes, rales or rhonchi, good air movement  CV: HRRR, no peripheral edema  MS: moves all extremities without noticeable abnormality, reproducible pain with palpation of muscles in focal area of L lateral/posterior chest wall.  PSYCH: pleasant and cooperative, no obvious depression or anxiety  ASSESSMENT AND PLAN:  Discussed the following assessment and plan:  Muscle soreness - Plan: cyclobenzaprine (FLEXERIL) 5 MG tablet  CAP (community acquired pneumonia)  -rebroducible pain on exam, liekly muscular -seems infectious process is resolving but with occ sputum still colored at times advised to start the Levaquin if any worsening over the weekend -also advised of other etiologies of chest pain and advised prompt ER or  UCC eval if worsening or not improving over the weekend with tx muscle strain/spasm -also offered re-imaging and pulm eval, declined today as he feels is improving and that this is muscular. -repeat CXR still advised in a few weeks to ensure resolution -of course, we advised to return or notify a doctor immediately if symptoms worsen or persist or new concerns arise.    Patient Instructions  Heat 15 minutes twice daily  Muscle relaxer twice daily as needed  Tylenol or aleve per instructions.  Topical sports creams.  Seek care at urgent care or  emergency room over the weekend if worsening, new symptoms or not improving.     Daniel Mata R.

## 2015-06-09 NOTE — Patient Instructions (Signed)
Heat 15 minutes twice daily  Muscle relaxer twice daily as needed  Tylenol or aleve per instructions.  Topical sports creams.  Seek care at urgent care or emergency room over the weekend if worsening, new symptoms or not improving.

## 2015-06-09 NOTE — Telephone Encounter (Signed)
Talked with pt. Changed rx.  Wendie Simmer, can you obtain records from Dr. Irven Shelling office to update PMH accordingly if has history of heart block. Thanks.

## 2015-06-09 NOTE — Telephone Encounter (Signed)
Pt was prescribed cyclobenzaprine (FLEXERIL) 5 MG tablet today. Pt states he researched this med and it states not to take if you have had a heart block.  Pt states a year ago he was diagnosed with a  Right Bundle Therapist, sports at [facility].  Pt would like Dr Maudie Mercury to advise if OK for pt to take the flexeril  [facility]/ [facility]

## 2015-06-12 NOTE — Telephone Encounter (Signed)
I called Dr Irven Shelling office and spoke with Rise Paganini and she stated she will fax the office note and cath results to our office.

## 2015-06-13 ENCOUNTER — Ambulatory Visit: Payer: BLUE CROSS/BLUE SHIELD | Admitting: Family Medicine

## 2015-06-19 ENCOUNTER — Telehealth: Payer: Self-pay | Admitting: Internal Medicine

## 2015-06-19 DIAGNOSIS — J189 Pneumonia, unspecified organism: Secondary | ICD-10-CM

## 2015-06-19 NOTE — Telephone Encounter (Signed)
Pt states Dr Maudie Mercury was to have him do a chest xray before his next follow up on 06/30/15  due to his having dx with pna on 06/09/15 . Pt will need an order for an xray.

## 2015-06-19 NOTE — Telephone Encounter (Signed)
I called the pt and informed him the order was entered, he can go to the [place] office for the x-ray a few days prior to his appt with Dr Maudie Mercury and he agreed.

## 2015-06-30 ENCOUNTER — Ambulatory Visit: Payer: BLUE CROSS/BLUE SHIELD | Admitting: Family Medicine

## 2015-07-03 ENCOUNTER — Telehealth: Payer: Self-pay | Admitting: Internal Medicine

## 2015-07-03 MED ORDER — VALSARTAN 80 MG PO TABS: ORAL_TABLET | ORAL | Status: DC

## 2015-07-03 NOTE — Telephone Encounter (Signed)
Rx sent 

## 2015-07-03 NOTE — Telephone Encounter (Signed)
Pt needs refill on diovan 80 mg #90 w/refills send to [address] . Daniel Mata pt has an cpx sch w/cory on 09-29-15

## 2015-07-31 ENCOUNTER — Telehealth: Payer: Self-pay | Admitting: Internal Medicine

## 2015-07-31 NOTE — Telephone Encounter (Signed)
[facility] - Client [place] [facility] Patient Name: Kendle Yoshino DOB: 1976-03-04 Initial Comment Caller states he's having tightness in chest, nausea. Nurse Assessment Nurse: Dimas Chyle, RN, Dellis Filbert Date/Time Eilene Ghazi Time): 07/31/2015 8:35:50 AM Confirm and document reason for call. If symptomatic, describe symptoms. You must click the next button to save text entered. ---Caller states he's having tightness in chest, nausea. Started on Saturday. Having palpitations on Friday and Saturday. Has the patient traveled out of the country within the last 30 days? ---No Does the patient have any new or worsening symptoms? ---Yes Will a triage be completed? ---Yes Related visit to physician within the last 2 weeks? ---No Does the PT have any chronic conditions? (i.e. diabetes, asthma, etc.) ---Yes List chronic conditions. ---HTN Is this a behavioral health or substance abuse call? ---No Guidelines Guideline Title Affirmed Question Affirmed Notes Chest Pain [1] Intermittent chest pain or "angina" AND [2] increasing in severity or frequency (Exception: pains lasting a few seconds) Final Disposition User Go to ED Now Dimas Chyle, RN, Dellis Filbert Referrals GO TO FACILITY UNDECIDED Disagree/Comply: Leta Baptist

## 2015-07-31 NOTE — Telephone Encounter (Signed)
Left message on machine for patient to return our call 

## 2015-08-01 NOTE — Telephone Encounter (Signed)
Left message on machine for patient to return our call 

## 2015-09-22 ENCOUNTER — Other Ambulatory Visit: Payer: BLUE CROSS/BLUE SHIELD

## 2015-09-25 ENCOUNTER — Other Ambulatory Visit (INDEPENDENT_AMBULATORY_CARE_PROVIDER_SITE_OTHER): Payer: BLUE CROSS/BLUE SHIELD

## 2015-09-25 DIAGNOSIS — Z0000 Encounter for general adult medical examination without abnormal findings: Secondary | ICD-10-CM

## 2015-09-25 LAB — BASIC METABOLIC PANEL
BUN: 14 mg/dL (ref 6–23)
CHLORIDE: 103 meq/L (ref 96–112)
CO2: 28 meq/L (ref 19–32)
Calcium: 9.3 mg/dL (ref 8.4–10.5)
Creatinine, Ser: 0.92 mg/dL (ref 0.40–1.50)
GFR: 96.21 mL/min (ref >60.00)
GLUCOSE: 104 mg/dL — AB (ref 70–99)
POTASSIUM: 4.2 meq/L (ref 3.5–5.1)
SODIUM: 137 meq/L (ref 135–145)

## 2015-09-25 LAB — CBC WITH DIFFERENTIAL/PLATELET
BASOS PCT: 0.6 % (ref 0.0–3.0)
Basophils Absolute: 0 10*3/uL (ref 0.0–0.1)
EOS PCT: 2.7 % (ref 0.0–5.0)
Eosinophils Absolute: 0.2 10*3/uL (ref 0.0–0.7)
HCT: 43.1 % (ref 39.0–52.0)
Hemoglobin: 14.7 g/dL (ref 13.0–17.0)
LYMPHS ABS: 3.4 10*3/uL (ref 0.7–4.0)
Lymphocytes Relative: 47.7 % — ABNORMAL HIGH (ref 12.0–46.0)
MCHC: 34.2 g/dL (ref 30.0–36.0)
MCV: 88.8 fl (ref 78.0–100.0)
MONO ABS: 0.7 10*3/uL (ref 0.1–1.0)
MONOS PCT: 9.2 % (ref 3.0–12.0)
NEUTROS PCT: 39.8 % — AB (ref 43.0–77.0)
Neutro Abs: 2.9 10*3/uL (ref 1.4–7.7)
Platelets: 223 10*3/uL (ref 150.0–400.0)
RBC: 4.85 Mil/uL (ref 4.22–5.81)
RDW: 11.5 % (ref 11.5–15.5)
WBC: 7.2 10*3/uL (ref 4.0–10.5)

## 2015-09-25 LAB — POC URINALSYSI DIPSTICK (AUTOMATED)
Bilirubin, UA: NEGATIVE
Blood, UA: NEGATIVE
Glucose, UA: NEGATIVE
Ketones, UA: NEGATIVE
LEUKOCYTES UA: NEGATIVE
NITRITE UA: NEGATIVE
PH UA: 6.5
PROTEIN UA: NEGATIVE
Spec Grav, UA: <=1.005
Urobilinogen, UA: 0.2

## 2015-09-25 LAB — HEPATIC FUNCTION PANEL
ALBUMIN: 4.4 g/dL (ref 3.5–5.2)
ALT: 32 U/L (ref 0–53)
AST: 26 U/L (ref 0–37)
Alkaline Phosphatase: 53 U/L (ref 39–117)
BILIRUBIN TOTAL: 0.8 mg/dL (ref 0.2–1.2)
Bilirubin, Direct: 0.1 mg/dL (ref 0.0–0.3)
Total Protein: 7.2 g/dL (ref 6.0–8.3)

## 2015-09-25 LAB — LIPID PANEL
CHOLESTEROL: 168 mg/dL (ref 0–200)
HDL: 59.4 mg/dL (ref >39.00)
LDL Cholesterol: 93 mg/dL (ref 0–99)
NonHDL: 108.78
Total CHOL/HDL Ratio: 3
Triglycerides: 78 mg/dL (ref 0.0–149.0)
VLDL: 15.6 mg/dL (ref 0.0–40.0)

## 2015-09-25 LAB — TSH: TSH: 1.59 u[IU]/mL (ref 0.35–4.50)

## 2015-09-29 ENCOUNTER — Encounter: Payer: Self-pay | Admitting: Adult Health

## 2015-09-29 ENCOUNTER — Ambulatory Visit (INDEPENDENT_AMBULATORY_CARE_PROVIDER_SITE_OTHER): Payer: BLUE CROSS/BLUE SHIELD | Admitting: Adult Health

## 2015-09-29 VITALS — BP 140/80 | Temp 97.9°F | Ht 71.0 in | Wt 204.6 lb

## 2015-09-29 DIAGNOSIS — Z0000 Encounter for general adult medical examination without abnormal findings: Secondary | ICD-10-CM

## 2015-09-29 DIAGNOSIS — R05 Cough: Secondary | ICD-10-CM | POA: Diagnosis not present

## 2015-09-29 DIAGNOSIS — R059 Cough, unspecified: Secondary | ICD-10-CM

## 2015-09-29 DIAGNOSIS — I10 Essential (primary) hypertension: Secondary | ICD-10-CM | POA: Diagnosis not present

## 2015-09-29 MED ORDER — PREDNISONE 10 MG PO TABS
ORAL_TABLET | ORAL | Status: DC
Start: 2015-09-29 — End: 2016-10-01

## 2015-09-29 NOTE — Progress Notes (Signed)
Subjective:    Patient ID: Daniel Mata, male    DOB: [birth year], 40 y.o.   MRN: [id number]  HPI  Patient presents for yearly preventative medicine examination. He is a healthy and active 40 year old male who  has a past medical history of GERD (gastroesophageal reflux disease); History of chickenpox; Allergy; and Hypertension.  All immunizations and health maintenance protocols were reviewed with the patient and needed orders were placed.  Medication reconciliation,  past medical history, social history, problem list and allergies were reviewed in detail with the patient  Goals were established with regard to weight loss, exercise, and  diet in compliance with medications. He is weight bearing exercise and mountain bikes twice a week.   He monitors his blood pressure at home and reports that it has been in the 120-130 range.   His only complaint is that of a lingering semi productive cough. He was treated for pneumonia three months ago and since being treated he has had this cough. Feels fine otherwise.    Review of Systems  Constitutional: Negative.   HENT: Negative.   Eyes: Negative.   Respiratory: Positive for cough and chest tightness. Negative for shortness of breath and wheezing.   Cardiovascular: Negative.   Gastrointestinal: Negative.   Endocrine: Negative.   Genitourinary: Negative.   Musculoskeletal: Negative.   Skin: Negative.   Allergic/Immunologic: Negative.   Neurological: Negative.   Hematological: Negative.   Psychiatric/Behavioral: Negative.   All other systems reviewed and are negative.  Past Medical History  Diagnosis Date  . GERD (gastroesophageal reflux disease)   . History of chickenpox   . Allergy   . Hypertension     Social History   Social History  . Marital Status: Married    Spouse Name: [name]  . Number of Children: 2  . Years of Education: N/A   Occupational History  . Not on file.   Social History Main Topics  . Smoking  status: Former Research scientist (life sciences)  . Smokeless tobacco: Not on file  . Alcohol Use: Yes  . Drug Use: No  . Sexual Activity: Not on file   Other Topics Concern  . Not on file   Social History Narrative   Self employed   Two children - ages 13 and 31    Past Surgical History  Procedure Laterality Date  . Tonsillectomy and adenoidectomy      Family History  Problem Relation Age of Onset  . Hypertension Mother   . Cancer Maternal Aunt     great aunt had kidney cancer  . Cancer Paternal Uncle     great uncle had prostate cancer  . Hypertension Maternal Grandmother   . Diabetes Maternal Grandmother   . Cancer Maternal Grandmother     kidney  . Heart disease Paternal Grandfather     Allergies  Allergen Reactions  . Histamine Swelling    Related to spice that may have been in the crab boil or shrimp.  . Nutmeg Oil (Myristica Oil) Swelling  . Other Other (See Comments)    Any spicy foods or spicy ingredients-Causes migraines  . Shrimp [Shellfish Allergy] Other (See Comments)    migraines    Current Outpatient Prescriptions on File Prior to Visit  Medication Sig Dispense Refill  . scopolamine (TRANSDERM-SCOP, 1.5 MG,) 1 MG/3DAYS Apply 4 hours before event and every 72 hours after 3 patch 0  . tiZANidine (ZANAFLEX) 2 MG tablet Take 1 tablet (2 mg total) by mouth every 8 (eight)  hours as needed for muscle spasms. 20 tablet 0  . valsartan (DIOVAN) 80 MG tablet TAKE 1 TABLET (80 MG TOTAL) BY MOUTH DAILY. 90 tablet 2   No current facility-administered medications on file prior to visit.    BP 140/80 mmHg  Temp(Src) 97.9 F (36.6 C) (Oral)  Ht 5\' 11"  (1.803 m)  Wt 204 lb 9.6 oz (92.806 kg)  BMI 28.55 kg/m2     Objective:   Physical Exam  Constitutional: He is oriented to person, place, and time. He appears well-developed and well-nourished. No distress.  HENT:  Head: Normocephalic and atraumatic.  Right Ear: External ear normal.  Left Ear: External ear normal.  Nose: Nose  normal.  Mouth/Throat: Oropharynx is clear and moist. No oropharyngeal exudate.  Eyes: Conjunctivae and EOM are normal. Pupils are equal, round, and reactive to light. Right eye exhibits no discharge. Left eye exhibits no discharge. No scleral icterus.  Neck: Normal range of motion. Neck supple. No tracheal deviation present. No thyromegaly present.  Cardiovascular: Normal rate, regular rhythm, normal heart sounds and intact distal pulses.  Exam reveals no gallop and no friction rub.   No murmur heard. Pulmonary/Chest: Effort normal and breath sounds normal. No stridor. No respiratory distress. He has no wheezes. He has no rales. He exhibits no tenderness.  Dry cough   Abdominal: Soft. Bowel sounds are normal. He exhibits no distension and no mass. There is no tenderness. There is no guarding.  Genitourinary:  Deferred  Musculoskeletal: Normal range of motion. He exhibits no edema or tenderness.  Lymphadenopathy:    He has no cervical adenopathy.  Neurological: He is alert and oriented to person, place, and time. He has normal reflexes. He displays normal reflexes. No cranial nerve deficit. He exhibits normal muscle tone. Coordination normal.  Skin: Skin is warm and dry. No rash noted. No erythema. No pallor.  Psychiatric: He has a normal mood and affect. His behavior is normal. Judgment and thought content normal.  Vitals reviewed.     Assessment & Plan:  1. Routine general medical examination at a health care facility - Reviewed labs in detail with patient and all questions answered - Continue to eat healthy and exercise - Follow up in one year or sooner if needed  2. Essential hypertension - Continue with current therapy -  Continue to monitor at home and inform this provider if BP consistently above 130's/140's - Consider going up on Diovan 3. Cough - Can also use Mucinex  - predniSONE (DELTASONE) 10 MG tablet; 40 mg x 3 days, 20 mg x 3 days, 10 mg x 3 days  Dispense: 21 tablet;  Refill: 0 - Follow up if no improvement   Dorothyann Peng, NP

## 2015-11-08 ENCOUNTER — Telehealth: Payer: Self-pay | Admitting: Adult Health

## 2015-11-08 NOTE — Telephone Encounter (Signed)
Please advise 

## 2015-11-08 NOTE — Telephone Encounter (Signed)
Pt has been monitoring his bp. In the am his bp is perfect, but in the evening it runs about 130/90.  Pt is wondering if this is ok, or should he increase his valsartan (DIOVAN) 80 MG tablet  Pt states this was discussed at last visit.  [employer] [facility] [employer]

## 2015-11-08 NOTE — Telephone Encounter (Signed)
Stay with the same dose. I am afraid if we go up right now it is going to drop you to much. We will continue to monitor.

## 2015-11-14 ENCOUNTER — Other Ambulatory Visit: Payer: Self-pay

## 2015-11-14 NOTE — Telephone Encounter (Signed)
Patient notified and verbalized understanding. 

## 2016-01-26 ENCOUNTER — Ambulatory Visit: Payer: BLUE CROSS/BLUE SHIELD

## 2016-01-26 ENCOUNTER — Ambulatory Visit (INDEPENDENT_AMBULATORY_CARE_PROVIDER_SITE_OTHER): Payer: 59

## 2016-01-26 DIAGNOSIS — Z23 Encounter for immunization: Secondary | ICD-10-CM | POA: Diagnosis not present

## 2016-05-10 ENCOUNTER — Other Ambulatory Visit: Payer: Self-pay | Admitting: Internal Medicine

## 2016-05-14 NOTE — Telephone Encounter (Signed)
Pt request refill  valsartan (DIOVAN) 80 MG tablet  90 day  [facility] [address] [place], [place]

## 2016-06-22 IMAGING — DX DG CHEST 2V
2 series · 2 of 2 positions shown · non-contrast
Comparison: PA and lateral chest x-ray April 14, 2014 PA chest
x-ray April 13, 2009

CLINICAL DATA: Follow-up of pneumonia ; patient on antibiotics for
the past 3 days; persistent cough, shortness of breath, and chest
soreness. Patient is a former smoker.

EXAM:
CHEST  2 VIEW

[chest pa]
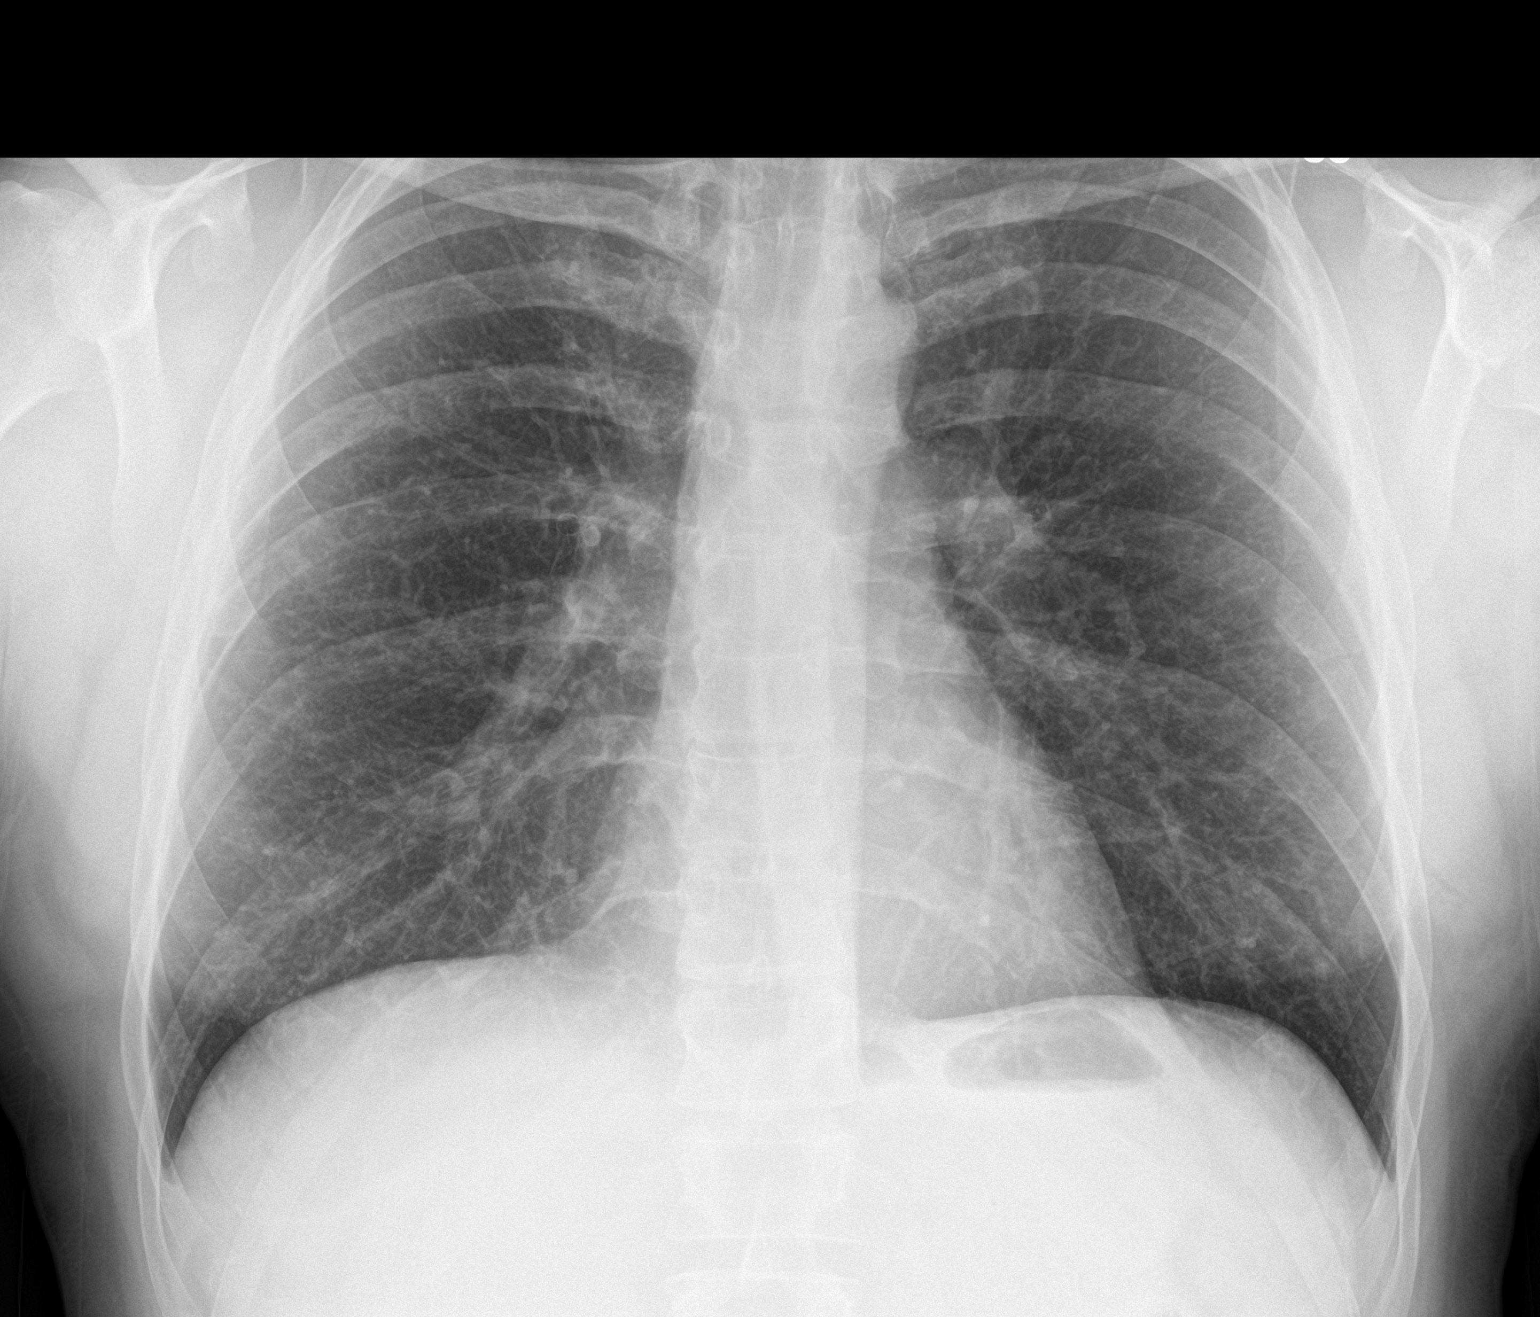

[chest lat]
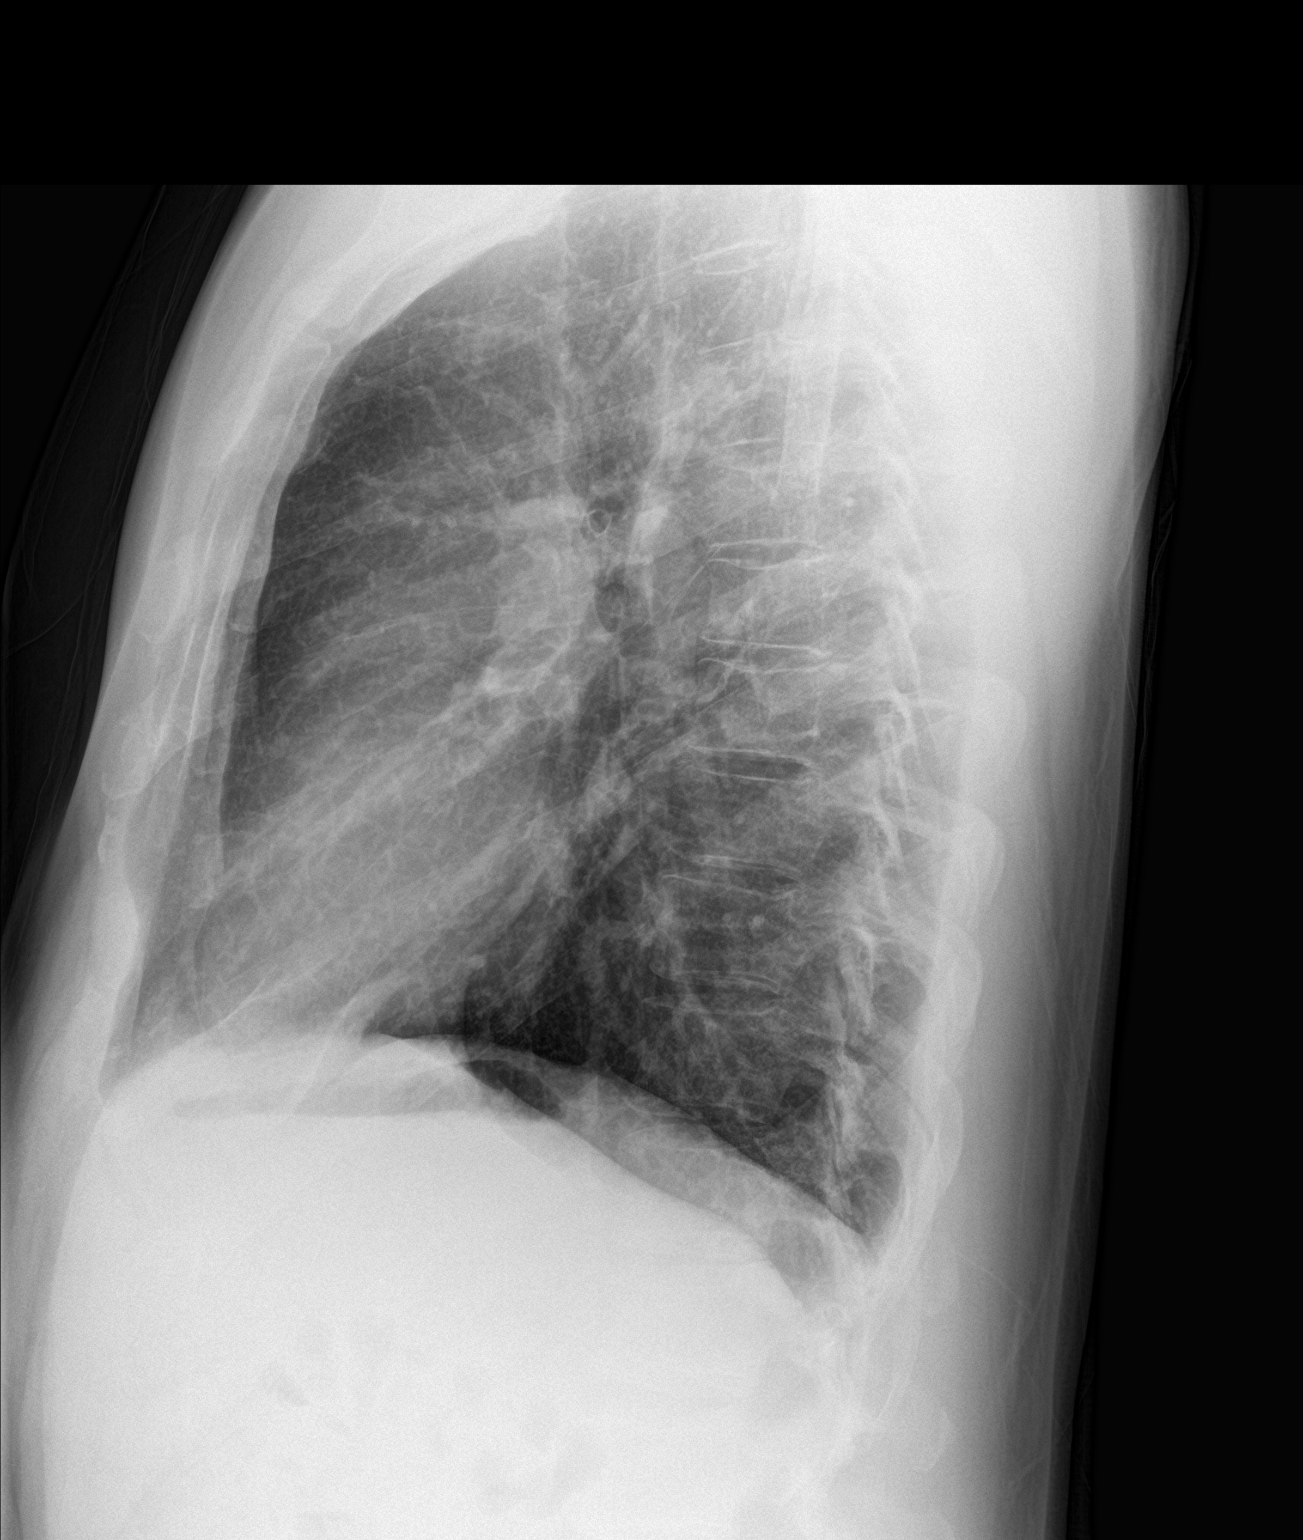

[2 of 2 positions shown; findings below may reference images not displayed]

FINDINGS: The lungs are well-expanded. The interstitial markings within both
lungs are increased. There is no alveolar infiltrate. There is no
pleural effusion. There is no pneumothorax or pneumomediastinum. The
heart and pulmonary vascularity are normal. The bony thorax exhibits
no acute abnormality.
IMPRESSION: Mild diffuse interstitial prominence has developed since the April 2014 study. This may reflect an interstitial pneumonia or
pneumonitis type process. There is no alveolar pneumonia.

Followup PA and lateral chest X-ray is recommended in 3-4 weeks
following trial of antibiotic therapy to ensure resolution. If the
patient's symptoms do not respond to the current antibiotic therapy,
chest CT scanning would be the most useful next imaging step.

## 2016-07-12 ENCOUNTER — Other Ambulatory Visit: Payer: Self-pay | Admitting: Gastroenterology

## 2016-07-12 DIAGNOSIS — R110 Nausea: Secondary | ICD-10-CM

## 2016-07-12 DIAGNOSIS — R1084 Generalized abdominal pain: Secondary | ICD-10-CM

## 2016-09-06 ENCOUNTER — Other Ambulatory Visit: Payer: Self-pay | Admitting: Adult Health

## 2016-09-10 ENCOUNTER — Telehealth: Payer: Self-pay | Admitting: Adult Health

## 2016-09-10 ENCOUNTER — Other Ambulatory Visit: Payer: Self-pay

## 2016-09-10 MED ORDER — VALSARTAN 80 MG PO TABS: 80.0000 mg | ORAL_TABLET | Freq: Every day | ORAL | 0 refills | Status: DC

## 2016-09-10 NOTE — Telephone Encounter (Signed)
Pt has sch for cps on 10-01-16 . Pt would like refills on valsartan [place]

## 2016-09-10 NOTE — Telephone Encounter (Signed)
Rx has been refilled.  

## 2016-10-01 ENCOUNTER — Ambulatory Visit (INDEPENDENT_AMBULATORY_CARE_PROVIDER_SITE_OTHER): Payer: 59 | Admitting: Adult Health

## 2016-10-01 ENCOUNTER — Encounter: Payer: Self-pay | Admitting: Adult Health

## 2016-10-01 VITALS — BP 118/72 | HR 64 | Temp 98.4°F | Ht 72.0 in | Wt 206.3 lb

## 2016-10-01 DIAGNOSIS — I10 Essential (primary) hypertension: Secondary | ICD-10-CM

## 2016-10-01 DIAGNOSIS — Z0000 Encounter for general adult medical examination without abnormal findings: Secondary | ICD-10-CM

## 2016-10-01 DIAGNOSIS — Z23 Encounter for immunization: Secondary | ICD-10-CM

## 2016-10-01 LAB — BASIC METABOLIC PANEL
BUN: 14 mg/dL (ref 6–23)
CHLORIDE: 102 meq/L (ref 96–112)
CO2: 29 meq/L (ref 19–32)
CREATININE: 0.96 mg/dL (ref 0.40–1.50)
Calcium: 9.9 mg/dL (ref 8.4–10.5)
GFR: 91.15 mL/min (ref >60.00)
Glucose, Bld: 99 mg/dL (ref 70–99)
Potassium: 4.2 mEq/L (ref 3.5–5.1)
Sodium: 137 mEq/L (ref 135–145)

## 2016-10-01 LAB — LIPID PANEL
CHOLESTEROL: 192 mg/dL (ref 0–200)
HDL: 58.8 mg/dL (ref >39.00)
LDL CALC: 114 mg/dL — AB (ref 0–99)
NonHDL: 133.31
Total CHOL/HDL Ratio: 3
Triglycerides: 99 mg/dL (ref 0.0–149.0)
VLDL: 19.8 mg/dL (ref 0.0–40.0)

## 2016-10-01 LAB — CBC WITH DIFFERENTIAL/PLATELET
Basophils Absolute: 0 10*3/uL (ref 0.0–0.1)
Basophils Relative: 0.6 % (ref 0.0–3.0)
EOS PCT: 2.8 % (ref 0.0–5.0)
Eosinophils Absolute: 0.2 10*3/uL (ref 0.0–0.7)
HEMATOCRIT: 43.2 % (ref 39.0–52.0)
HEMOGLOBIN: 14.9 g/dL (ref 13.0–17.0)
LYMPHS PCT: 47.3 % — AB (ref 12.0–46.0)
Lymphs Abs: 3.1 10*3/uL (ref 0.7–4.0)
MCHC: 34.5 g/dL (ref 30.0–36.0)
MCV: 89.7 fl (ref 78.0–100.0)
MONO ABS: 0.6 10*3/uL (ref 0.1–1.0)
MONOS PCT: 9.3 % (ref 3.0–12.0)
Neutro Abs: 2.6 10*3/uL (ref 1.4–7.7)
Neutrophils Relative %: 40 % — ABNORMAL LOW (ref 43.0–77.0)
Platelets: 220 10*3/uL (ref 150.0–400.0)
RBC: 4.81 Mil/uL (ref 4.22–5.81)
RDW: 12.1 % (ref 11.5–15.5)
WBC: 6.6 10*3/uL (ref 4.0–10.5)

## 2016-10-01 LAB — HEPATIC FUNCTION PANEL
ALT: 44 U/L (ref 0–53)
AST: 23 U/L (ref 0–37)
Albumin: 4.6 g/dL (ref 3.5–5.2)
Alkaline Phosphatase: 51 U/L (ref 39–117)
BILIRUBIN TOTAL: 0.8 mg/dL (ref 0.2–1.2)
Bilirubin, Direct: 0.2 mg/dL (ref 0.0–0.3)
Total Protein: 7.5 g/dL (ref 6.0–8.3)

## 2016-10-01 LAB — TSH: TSH: 1.34 u[IU]/mL (ref 0.35–4.50)

## 2016-10-01 MED ORDER — VALSARTAN 40 MG PO TABS: 80.0000 mg | ORAL_TABLET | Freq: Every day | ORAL | 3 refills | Status: DC

## 2016-10-01 NOTE — Patient Instructions (Addendum)
It was great seeing you today   I have sent in a new prescription for Diovan 40 mg - please let me know how your blood pressures respond to this   I will follow up with you regarding your blood work   Check out [facility] for carotid artery scans

## 2016-10-01 NOTE — Progress Notes (Signed)
Subjective:    Patient ID: Daniel Mata, male    DOB: 08-Jul-1975, 41 y.o.   MRN: [id number]  HPI  Patient presents for yearly preventative medicine examination. He is a pleasant 41 year old male who  has a past medical history of Allergy; GERD (gastroesophageal reflux disease); History of chickenpox; and Hypertension.  All immunizations and health maintenance protocols were reviewed with the patient and needed orders were placed.  Appropriate screening laboratory values were ordered for the patient including screening of hyperlipidemia, renal function and hepatic function.  Medication reconciliation,  past medical history, social history, problem list and allergies were reviewed in detail with the patient  Goals were established with regard to weight loss, exercise, and  diet in compliance with medications. He is working on his diet and is eating whole foods as well as  doing Yoga.   His blood pressure is down and he would like to try cutting back on the amount of Diovan he is taking.   He has been seen by Dermatology within the last 6 months   He has no acute complaints today    Review of Systems  Constitutional: Negative.   HENT: Negative.   Eyes: Negative.   Respiratory: Negative.   Cardiovascular: Negative.   Gastrointestinal: Negative.   Endocrine: Negative.   Genitourinary: Negative.   Musculoskeletal: Negative.   Skin: Negative.   Allergic/Immunologic: Negative.   Neurological: Negative.   Hematological: Negative.   Psychiatric/Behavioral: Negative.   All other systems reviewed and are negative.  Past Medical History:  Diagnosis Date  . Allergy   . GERD (gastroesophageal reflux disease)   . History of chickenpox   . Hypertension     Social History   Social History  . Marital status: Married    Spouse name: [name]  . Number of children: 2  . Years of education: N/A   Occupational History  . Not on file.   Social History Main Topics  . Smoking  status: Former Research scientist (life sciences)  . Smokeless tobacco: Not on file  . Alcohol use Yes  . Drug use: No  . Sexual activity: Not on file   Other Topics Concern  . Not on file   Social History Narrative   Self employed   Two children - ages 62 and 32    Past Surgical History:  Procedure Laterality Date  . TONSILLECTOMY AND ADENOIDECTOMY      Family History  Problem Relation Age of Onset  . Hypertension Mother   . Cancer Maternal Aunt        great aunt had kidney cancer  . Cancer Paternal Uncle        great uncle had prostate cancer  . Hypertension Maternal Grandmother   . Diabetes Maternal Grandmother   . Cancer Maternal Grandmother        kidney  . Heart disease Paternal Grandfather     Allergies  Allergen Reactions  . Histamine Swelling    Related to spice that may have been in the crab boil or shrimp.  . Nutmeg Oil (Myristica Oil) Swelling  . Other Other (See Comments)    Any spicy foods or spicy ingredients-Causes migraines  . Shrimp [Shellfish Allergy] Other (See Comments)    migraines    Current Outpatient Prescriptions on File Prior to Visit  Medication Sig Dispense Refill  . valsartan (DIOVAN) 80 MG tablet Take 1 tablet (80 mg total) by mouth daily. 30 tablet 0   No current facility-administered medications on file  prior to visit.     BP 118/72 (BP Location: Left Arm, Patient Position: Sitting, Cuff Size: Normal)   Pulse 64   Temp 98.4 F (36.9 C) (Oral)   Ht 6' (1.829 m)   Wt 206 lb 4.8 oz (93.6 kg)   SpO2 98%   BMI 27.98 kg/m       Objective:   Physical Exam  Constitutional: He is oriented to person, place, and time. He appears well-developed and well-nourished. No distress.  HENT:  Head: Normocephalic and atraumatic.  Right Ear: External ear normal.  Left Ear: External ear normal.  Nose: Nose normal.  Mouth/Throat: Oropharynx is clear and moist. No oropharyngeal exudate.  Eyes: Conjunctivae and EOM are normal. Pupils are equal, round, and reactive to  light. Right eye exhibits no discharge. Left eye exhibits no discharge. No scleral icterus.  Neck: Neck supple. No tracheal deviation present. No thyromegaly present.  Cardiovascular: Normal rate, regular rhythm, normal heart sounds and intact distal pulses.  Exam reveals no gallop and no friction rub.   No murmur heard. Pulmonary/Chest: Effort normal and breath sounds normal. No stridor. No respiratory distress. He has no wheezes. He has no rales. He exhibits no tenderness.  Abdominal: Soft. Bowel sounds are normal. He exhibits no distension and no mass. There is no tenderness. There is no rebound and no guarding.  Musculoskeletal: Normal range of motion. He exhibits no edema, tenderness or deformity.  Lymphadenopathy:    He has no cervical adenopathy.  Neurological: He is alert and oriented to person, place, and time. He has normal reflexes. He displays normal reflexes. No cranial nerve deficit. He exhibits normal muscle tone. Coordination normal.  Skin: Skin is warm and dry. No rash noted. He is not diaphoretic. No erythema. No pallor.  Psychiatric: He has a normal mood and affect. His behavior is normal. Judgment and thought content normal.  Nursing note and vitals reviewed.     Assessment & Plan:  1. Routine general medical examination at a health care facility - Continue to eat healthy. Add weight lifting to exercise regimen  - Follow up in one year or sooner if needed - Tetanus Booster given  - Basic metabolic panel - CBC with Differential/Platelet - Hepatic function panel - Lipid panel - TSH  2. Essential hypertension - I am ok with trying to cut Diovan in half. He is asked to follow up with me via mychart about how he responds to this  - valsartan (DIOVAN) 40 MG tablet; Take 2 tablets (80 mg total) by mouth daily.  Dispense: 90 tablet; Refill: 3 - Basic metabolic panel - CBC with Differential/Platelet - Hepatic function panel - Lipid panel - TSH   Dorothyann Peng, NP

## 2016-10-24 ENCOUNTER — Encounter: Payer: Self-pay | Admitting: Adult Health

## 2016-11-12 ENCOUNTER — Encounter: Payer: Self-pay | Admitting: Adult Health

## 2016-11-12 ENCOUNTER — Telehealth: Payer: Self-pay | Admitting: Family Medicine

## 2016-11-12 NOTE — Telephone Encounter (Signed)
Left a message on home/cell for a return call. 

## 2016-11-12 NOTE — Telephone Encounter (Signed)
Misty pt called back and stated that he will not be available in the next 5 mins and state that the pharmacy said that he should ask his PCP about the losartan and he would like to move forward with that if PCP approve.  Pharm:  [facility].

## 2016-11-12 NOTE — Telephone Encounter (Signed)
Left a message for a return call.

## 2016-11-12 NOTE — Telephone Encounter (Signed)
Received fax from the pharmacy.  Need a replacement for valsartan.  Pt's has been recalled.  Please advise.  Thanks!!

## 2016-11-12 NOTE — Telephone Encounter (Signed)
Cozzar 50 mg daily. Follow up in 2 weeks for recheck

## 2016-11-13 MED ORDER — LOSARTAN POTASSIUM 50 MG PO TABS: 50.0000 mg | ORAL_TABLET | Freq: Every day | ORAL | 0 refills | Status: DC

## 2016-11-13 NOTE — Telephone Encounter (Signed)
Medication sent to the pharmacy.  Pt sent MyChart message.  Will respond and close this note.

## 2016-12-06 ENCOUNTER — Other Ambulatory Visit: Payer: Self-pay | Admitting: Adult Health

## 2016-12-06 ENCOUNTER — Ambulatory Visit (INDEPENDENT_AMBULATORY_CARE_PROVIDER_SITE_OTHER): Payer: Self-pay | Admitting: Adult Health

## 2016-12-06 ENCOUNTER — Encounter: Payer: Self-pay | Admitting: Adult Health

## 2016-12-06 VITALS — BP 120/96 | Ht 72.0 in | Wt 187.0 lb

## 2016-12-06 DIAGNOSIS — I10 Essential (primary) hypertension: Secondary | ICD-10-CM

## 2016-12-06 LAB — BASIC METABOLIC PANEL
BUN: 8 mg/dL (ref 6–23)
CHLORIDE: 103 meq/L (ref 96–112)
CO2: 27 mEq/L (ref 19–32)
Calcium: 9.8 mg/dL (ref 8.4–10.5)
Creatinine, Ser: 0.95 mg/dL (ref 0.40–1.50)
GFR: 92.18 mL/min (ref >60.00)
Glucose, Bld: 97 mg/dL (ref 70–99)
POTASSIUM: 4.4 meq/L (ref 3.5–5.1)
Sodium: 137 mEq/L (ref 135–145)

## 2016-12-06 LAB — CBC WITH DIFFERENTIAL/PLATELET
BASOS ABS: 0 10*3/uL (ref 0.0–0.1)
BASOS PCT: 0.8 % (ref 0.0–3.0)
EOS ABS: 0.1 10*3/uL (ref 0.0–0.7)
Eosinophils Relative: 2.5 % (ref 0.0–5.0)
HCT: 45.2 % (ref 39.0–52.0)
Hemoglobin: 15.5 g/dL (ref 13.0–17.0)
Lymphocytes Relative: 34.4 % (ref 12.0–46.0)
Lymphs Abs: 2 10*3/uL (ref 0.7–4.0)
MCHC: 34.2 g/dL (ref 30.0–36.0)
MCV: 90.9 fl (ref 78.0–100.0)
Monocytes Absolute: 0.5 10*3/uL (ref 0.1–1.0)
Monocytes Relative: 8.5 % (ref 3.0–12.0)
NEUTROS ABS: 3.1 10*3/uL (ref 1.4–7.7)
NEUTROS PCT: 53.8 % (ref 43.0–77.0)
Platelets: 216 10*3/uL (ref 150.0–400.0)
RBC: 4.98 Mil/uL (ref 4.22–5.81)
RDW: 12 % (ref 11.5–15.5)
WBC: 5.8 10*3/uL (ref 4.0–10.5)

## 2016-12-06 MED ORDER — AMLODIPINE BESYLATE 5 MG PO TABS: 5.0000 mg | ORAL_TABLET | Freq: Every day | ORAL | 1 refills | Status: DC

## 2016-12-06 NOTE — Patient Instructions (Signed)
It was great seeing you today and I am sorry you are feeling this way   I believe that this is from the medication. I am going to get some blood work on you and will follow up with you regarding this.   I am also going to change your blood pressure medication

## 2016-12-06 NOTE — Progress Notes (Signed)
Subjective:    Patient ID: Daniel Mata, male    DOB: Oct 05, 1975, 41 y.o.   MRN: [id number]  HPI  41 year old male who  has a past medical history of Allergy; GERD (gastroesophageal reflux disease); History of chickenpox; and Hypertension. He presents to the office today for the acute complaint of of elevated blood pressure. He was changed from Valsartan to Cozaar 50mg   about three weeks ago due to recall of medication.    Today in the office he reports that for the first 2 weeks his blood pressure was well controlled. Starting 2-3 days ago he noticed that his blood pressure was " all over the place". He was experiencing blood pressure readings in the 120's and then blood pressure readings in the 160's. During this time he was he started to " not feel well". He has been experiencing episodes of dizziness, headaches, and tingling in his arms and legs. When he woke up this morning he experienced left sided chest pain.   Only change over the last three weeks is he has switched to a plant based diet    Today in the office his blood pressure is 162/108 on initial readings. Second reading  Was 120/96. He continues to feel dizzy and experiencing tingling  BP Readings from Last 3 Encounters:  12/06/16 (!) 120/96  10/01/16 118/72  09/29/15 140/80    Review of Systems See HPI   Past Medical History:  Diagnosis Date  . Allergy   . GERD (gastroesophageal reflux disease)   . History of chickenpox   . Hypertension     Social History   Social History  . Marital status: Married    Spouse name: [name]  . Number of children: 2  . Years of education: N/A   Occupational History  . Not on file.   Social History Main Topics  . Smoking status: Former Research scientist (life sciences)  . Smokeless tobacco: Never Used  . Alcohol use Yes  . Drug use: No  . Sexual activity: Yes   Other Topics Concern  . Not on file   Social History Narrative   Self employed   Two children - ages 27 and 19    Past Surgical  History:  Procedure Laterality Date  . TONSILLECTOMY AND ADENOIDECTOMY      Family History  Problem Relation Age of Onset  . Hypertension Mother   . Cancer Maternal Aunt        great aunt had kidney cancer  . Cancer Paternal Uncle        great uncle had prostate cancer  . Hypertension Maternal Grandmother   . Diabetes Maternal Grandmother   . Cancer Maternal Grandmother        kidney  . Heart disease Paternal Grandfather     Allergies  Allergen Reactions  . Histamine Swelling    Related to spice that may have been in the crab boil or shrimp.  . Nutmeg Oil (Myristica Oil) Swelling  . Other Other (See Comments)    Any spicy foods or spicy ingredients-Causes migraines  . Shrimp [Shellfish Allergy] Other (See Comments)    migraines    Current Outpatient Prescriptions on File Prior to Visit  Medication Sig Dispense Refill  . losartan (COZAAR) 50 MG tablet Take 1 tablet (50 mg total) by mouth daily. 30 tablet 0   No current facility-administered medications on file prior to visit.     BP (!) 120/96 (BP Location: Left Arm, Patient Position: Sitting, Cuff Size: Normal)  Ht 6' (1.829 m)   Wt 187 lb (84.8 kg)   BMI 25.36 kg/m       Objective:   Physical Exam  Constitutional: He is oriented to person, place, and time. He appears well-developed and well-nourished. No distress.  Eyes: Pupils are equal, round, and reactive to light. Conjunctivae and EOM are normal. Right eye exhibits no discharge. Left eye exhibits no discharge. No scleral icterus.  Cardiovascular: Normal rate, regular rhythm, normal heart sounds and intact distal pulses.  Exam reveals no gallop and no friction rub.   No murmur heard. Pulmonary/Chest: Effort normal and breath sounds normal. No respiratory distress. He has no wheezes. He has no rales. He exhibits tenderness (with palpation to left chest wall. ).  Neurological: He is alert and oriented to person, place, and time.  Skin: Skin is warm and dry. No  rash noted. He is not diaphoretic. No erythema. No pallor.  Psychiatric: His behavior is normal. Judgment and thought content normal. His mood appears anxious.  Nursing note and vitals reviewed.     Assessment & Plan:  1. Essential hypertension - Possible medication reaction? Will check labs to make sure sodium is WNLwith recent dietary switch - EKG 12-Lead- NSR with non specific QRS widening. Rate 92.  - CBC with Differential/Platelet - Basic metabolic panel - Likely switch medications  - Precautions given when to return or go to the ER  Dorothyann Peng, NP

## 2016-12-09 ENCOUNTER — Other Ambulatory Visit: Payer: Self-pay | Admitting: Adult Health

## 2016-12-09 ENCOUNTER — Encounter: Payer: Self-pay | Admitting: Adult Health

## 2016-12-11 ENCOUNTER — Other Ambulatory Visit: Payer: Self-pay | Admitting: Adult Health

## 2016-12-11 MED ORDER — VALSARTAN 80 MG PO TABS: 80.0000 mg | ORAL_TABLET | Freq: Every day | ORAL | 1 refills | Status: DC

## 2016-12-20 ENCOUNTER — Encounter: Payer: Self-pay | Admitting: Adult Health

## 2017-02-08 ENCOUNTER — Ambulatory Visit: Payer: 59 | Admitting: Family Medicine

## 2017-02-08 ENCOUNTER — Encounter: Payer: Self-pay | Admitting: Family Medicine

## 2017-02-08 VITALS — BP 124/82 | HR 60 | Temp 98.1°F | Wt 203.0 lb

## 2017-02-08 DIAGNOSIS — R21 Rash and other nonspecific skin eruption: Secondary | ICD-10-CM | POA: Diagnosis not present

## 2017-02-08 MED ORDER — PREDNISONE 10 MG PO TABS: ORAL_TABLET | ORAL | 0 refills | Status: DC

## 2017-02-08 NOTE — Patient Instructions (Signed)
Avoid heat which may exacerbate rash Follow-up promptly for any fever or other new symptoms Follow-up with primary if rash not improving over the next week

## 2017-02-08 NOTE — Progress Notes (Signed)
Subjective:     Patient ID: Daniel Mata, male   DOB: [birth year], 41 y.o.   MRN: [id number]  HPI Patient seen Saturday clinic with rash which started a few days ago..  About a week ago he developed onset of viral URI type symptoms. Had a couple days of sore throat but no fever. Has had some nasal congestion. His rash is somewhat irregular distribution includes trunk, upper and lower extremities. Somewhat vesicular in patches. Nonpruritic. Bilateral.  Tried Benadryl without any improvement. Denies any change of soaps or detergents. No recent travels. No history of similar rash. No family has similar rash.  Past Medical History:  Diagnosis Date  . Allergy   . GERD (gastroesophageal reflux disease)   . History of chickenpox   . Hypertension    Past Surgical History:  Procedure Laterality Date  . TONSILLECTOMY AND ADENOIDECTOMY      reports that he has quit smoking. he has never used smokeless tobacco. He reports that he drinks alcohol. He reports that he does not use drugs. family history includes Cancer in his maternal aunt, maternal grandmother, and paternal uncle; Diabetes in his maternal grandmother; Heart disease in his paternal grandfather; Hypertension in his maternal grandmother and mother. Allergies  Allergen Reactions  . Histamine Swelling    Related to spice that may have been in the crab boil or shrimp.  . Nutmeg Oil (Myristica Oil) Swelling  . Other Other (See Comments)    Any spicy foods or spicy ingredients-Causes migraines  . Shrimp [Shellfish Allergy] Other (See Comments)    migraines     Review of Systems  Constitutional: Negative for chills and fever.  HENT: Positive for congestion and sore throat. Negative for sneezing.   Skin: Positive for rash.       Objective:   Physical Exam  Constitutional: He appears well-developed and well-nourished.  HENT:  Right Ear: External ear normal.  Left Ear: External ear normal.  Mouth/Throat: Oropharynx is clear and moist.   Neck: Neck supple.  Cardiovascular: Normal rate and regular rhythm.  Pulmonary/Chest: Effort normal and breath sounds normal. No respiratory distress. He has no wheezes. He has no rales.  Abdominal: Soft. There is no tenderness.  Lymphadenopathy:    He has no cervical adenopathy.  Skin: Rash noted.  Patient has slightly raised vesicular rash in irregular distribution involving upper and lower extremities and trunk in several areas. Somewhat linear.and nontender. No pustules. Nonscaly.,       Assessment:     Skin rash. Question post viral. Does not appear urticarial and doubt contact dermatitis as nonpruritic.    Plan:     -Prednisone taper over the next week -Follow-up promptly for any fever or other concerns -follow up in one week with primary if not resolving.  Eulas Post MD  Primary Care at [facility]

## 2017-02-12 ENCOUNTER — Encounter: Payer: Self-pay | Admitting: Adult Health

## 2017-02-13 ENCOUNTER — Ambulatory Visit: Payer: 59 | Admitting: Adult Health

## 2017-02-13 ENCOUNTER — Encounter: Payer: Self-pay | Admitting: Adult Health

## 2017-02-13 VITALS — BP 120/84 | Temp 98.1°F | Wt 208.0 lb

## 2017-02-13 DIAGNOSIS — R21 Rash and other nonspecific skin eruption: Secondary | ICD-10-CM | POA: Diagnosis not present

## 2017-02-13 NOTE — Progress Notes (Signed)
Subjective:    Patient ID: Daniel Mata, male    DOB: Apr 15, 1975, 41 y.o.   MRN: [id number]  HPI  41 year old male who  has a past medical history of Allergy, GERD (gastroesophageal reflux disease), History of chickenpox, and Hypertension. He presents to the office today after being seen at Saturday for a rash throughout his body. His rash is somewhat irregular in distribution including the trunk, upper and lower extremities. He had somewhat of a vesicular patches. He was started on Prednisone which help resolved the rash.   His wife who has celiac disease thought that his rash looked like a " celiac rash". He quit gluten for three days and yesterday he was having a beer with co workers when his throat became itchy and felt tight. He did not break out in a rash   He would like to be tested for Celiac disease   Review of Systems See HPI   Past Medical History:  Diagnosis Date  . Allergy   . GERD (gastroesophageal reflux disease)   . History of chickenpox   . Hypertension     Social History   Socioeconomic History  . Marital status: Married    Spouse name: [name]  . Number of children: 2  . Years of education: Not on file  . Highest education level: Not on file  Social Needs  . Financial resource strain: Not on file  . Food insecurity - worry: Not on file  . Food insecurity - inability: Not on file  . Transportation needs - medical: Not on file  . Transportation needs - non-medical: Not on file  Occupational History  . Not on file  Tobacco Use  . Smoking status: Former Research scientist (life sciences)  . Smokeless tobacco: Never Used  Substance and Sexual Activity  . Alcohol use: Yes  . Drug use: No  . Sexual activity: Yes  Other Topics Concern  . Not on file  Social History Narrative   Self employed   Two children - ages 16 and 38    Past Surgical History:  Procedure Laterality Date  . TONSILLECTOMY AND ADENOIDECTOMY      Family History  Problem Relation Age of Onset  .  Hypertension Mother   . Cancer Maternal Aunt        great aunt had kidney cancer  . Cancer Paternal Uncle        great uncle had prostate cancer  . Hypertension Maternal Grandmother   . Diabetes Maternal Grandmother   . Cancer Maternal Grandmother        kidney  . Heart disease Paternal Grandfather     Allergies  Allergen Reactions  . Histamine Swelling    Related to spice that may have been in the crab boil or shrimp.  . Nutmeg Oil (Myristica Oil) Swelling  . Other Other (See Comments)    Any spicy foods or spicy ingredients-Causes migraines  . Shrimp [Shellfish Allergy] Other (See Comments)    migraines    Current Outpatient Medications on File Prior to Visit  Medication Sig Dispense Refill  . cetirizine (ZYRTEC) 10 MG tablet Take 10 mg by mouth daily.    . predniSONE (DELTASONE) 10 MG tablet Taper as follows: 4-4-4-3-3-2-2-1-1 24 tablet 0  . valsartan (DIOVAN) 80 MG tablet Take 1 tablet (80 mg total) by mouth daily. 90 tablet 1   No current facility-administered medications on file prior to visit.     BP 120/84 (BP Location: Left Arm)   Temp  98.1 F (36.7 C) (Oral)   Wt 208 lb (94.3 kg)   BMI 28.21 kg/m       Objective:   Physical Exam  Constitutional: He is oriented to person, place, and time. He appears well-developed and well-nourished. No distress.  Cardiovascular: Normal rate, regular rhythm, normal heart sounds and intact distal pulses. Exam reveals no gallop and no friction rub.  No murmur heard. Pulmonary/Chest: Effort normal and breath sounds normal. No respiratory distress. He has no wheezes. He has no rales. He exhibits no tenderness.  Neurological: He is alert and oriented to person, place, and time.  Skin: Skin is warm and dry. No rash noted. He is not diaphoretic. No erythema. No pallor.  Psychiatric: He has a normal mood and affect. His behavior is normal. Judgment and thought content normal.  Nursing note and vitals reviewed.     Assessment &  Plan:  1. Rash - Advised patient that celiac disease was rare but that I would be happy to test him for it. Continue to stay away from gluten until test results return  - Celiac Panel 10  Dorothyann Peng, NP

## 2017-02-17 LAB — CELIAC PANEL 10
ANTIGLIADIN ABS, IGA: 4 U (ref 0–19)
Endomysial IgA: NEGATIVE
GLIADIN IGG: 2 U (ref 0–19)
IgA/Immunoglobulin A, Serum: 228 mg/dL (ref 90–386)
Tissue Transglut Ab: 3 U/mL (ref 0–5)
Transglutaminase IgA: <2 U/mL (ref 0–3)

## 2017-03-06 ENCOUNTER — Ambulatory Visit: Payer: 59 | Admitting: Adult Health

## 2017-03-06 ENCOUNTER — Encounter: Payer: Self-pay | Admitting: Adult Health

## 2017-03-06 VITALS — BP 128/88 | HR 54 | Temp 97.7°F | Wt 203.0 lb

## 2017-03-06 DIAGNOSIS — J0140 Acute pansinusitis, unspecified: Secondary | ICD-10-CM | POA: Diagnosis not present

## 2017-03-06 MED ORDER — DOXYCYCLINE HYCLATE 100 MG PO CAPS: 100.0000 mg | ORAL_CAPSULE | Freq: Two times a day (BID) | ORAL | 0 refills | Status: DC

## 2017-03-06 NOTE — Progress Notes (Signed)
Subjective:    Patient ID: Daniel Mata, male    DOB: [birth year], 41 y.o.   MRN: [id number]  URI   This is a new problem. The current episode started 1 to 4 weeks ago (10 days ago ). There has been no fever. Associated symptoms include congestion, coughing, rhinorrhea and a sore throat. Pertinent negatives include no abdominal pain, ear pain, headaches, nausea, sinus pain, swollen glands or wheezing. Treatments tried: " all natural remedies" The treatment provided mild relief.    Review of Systems  Constitutional: Negative.   HENT: Positive for congestion, postnasal drip, rhinorrhea, sinus pressure and sore throat. Negative for ear pain and sinus pain.   Respiratory: Positive for cough and shortness of breath. Negative for wheezing.   Gastrointestinal: Negative for abdominal pain and nausea.  Neurological: Negative for headaches.   Past Medical History:  Diagnosis Date  . Allergy   . GERD (gastroesophageal reflux disease)   . History of chickenpox   . Hypertension     Social History   Socioeconomic History  . Marital status: Married    Spouse name: [name]  . Number of children: 2  . Years of education: Not on file  . Highest education level: Not on file  Social Needs  . Financial resource strain: Not on file  . Food insecurity - worry: Not on file  . Food insecurity - inability: Not on file  . Transportation needs - medical: Not on file  . Transportation needs - non-medical: Not on file  Occupational History  . Not on file  Tobacco Use  . Smoking status: Former Research scientist (life sciences)  . Smokeless tobacco: Never Used  Substance and Sexual Activity  . Alcohol use: Yes  . Drug use: No  . Sexual activity: Yes  Other Topics Concern  . Not on file  Social History Narrative   Self employed   Two children - ages 67 and 75    Past Surgical History:  Procedure Laterality Date  . TONSILLECTOMY AND ADENOIDECTOMY      Family History  Problem Relation Age of Onset  . Hypertension  Mother   . Cancer Maternal Aunt        great aunt had kidney cancer  . Cancer Paternal Uncle        great uncle had prostate cancer  . Hypertension Maternal Grandmother   . Diabetes Maternal Grandmother   . Cancer Maternal Grandmother        kidney  . Heart disease Paternal Grandfather     Allergies  Allergen Reactions  . Histamine Swelling    Related to spice that may have been in the crab boil or shrimp.  . Nutmeg Oil (Myristica Oil) Swelling  . Other Other (See Comments)    Any spicy foods or spicy ingredients-Causes migraines  . Shrimp [Shellfish Allergy] Other (See Comments)    migraines    Current Outpatient Medications on File Prior to Visit  Medication Sig Dispense Refill  . cetirizine (ZYRTEC) 10 MG tablet Take 10 mg by mouth daily.    . valsartan (DIOVAN) 80 MG tablet Take 1 tablet (80 mg total) by mouth daily. 90 tablet 1   No current facility-administered medications on file prior to visit.     BP 128/88 (BP Location: Right Arm, Patient Position: Sitting, Cuff Size: Normal)   Pulse (!) 54   Temp 97.7 F (36.5 C) (Oral)   Wt 203 lb (92.1 kg)   SpO2 97%   BMI 27.53 kg/m  Objective:   Physical Exam  Constitutional: He is oriented to person, place, and time. He appears well-developed and well-nourished. No distress.  HENT:  Head: Normocephalic and atraumatic.  Right Ear: Hearing, external ear and ear canal normal. Tympanic membrane is bulging. Tympanic membrane is not erythematous.  Left Ear: Hearing, external ear and ear canal normal. Tympanic membrane is bulging. Tympanic membrane is not erythematous.  Nose: Mucosal edema and rhinorrhea present. Right sinus exhibits maxillary sinus tenderness and frontal sinus tenderness. Left sinus exhibits maxillary sinus tenderness and frontal sinus tenderness.  Mouth/Throat: Uvula is midline, oropharynx is clear and moist and mucous membranes are normal. No oropharyngeal exudate.  Eyes: Conjunctivae and EOM are  normal. Pupils are equal, round, and reactive to light. Right eye exhibits no discharge. Left eye exhibits no discharge. No scleral icterus.  Cardiovascular: Normal rate, regular rhythm, normal heart sounds and intact distal pulses. Exam reveals no gallop and no friction rub.  No murmur heard. Pulmonary/Chest: Effort normal and breath sounds normal. No respiratory distress. He has no wheezes. He has no rales. He exhibits no tenderness.  Neurological: He is alert and oriented to person, place, and time.  Skin: Skin is warm and dry. No rash noted. He is not diaphoretic. No erythema. No pallor.  Psychiatric: He has a normal mood and affect. His behavior is normal. Judgment and thought content normal.  Nursing note and vitals reviewed.     Assessment & Plan:  1. Acute non-recurrent pansinusitis - Will treat due to time frame and symptoms  - doxycycline (VIBRAMYCIN) 100 MG capsule; Take 1 capsule (100 mg total) by mouth 2 (two) times daily.  Dispense: 14 capsule; Refill: 0 - Add flonase  - Rest and stay hydrated  - Follow up if no improvement in the next 2-3 days   Dorothyann Peng, NP

## 2017-03-13 ENCOUNTER — Encounter: Payer: Self-pay | Admitting: Adult Health

## 2017-03-20 ENCOUNTER — Ambulatory Visit: Payer: 59 | Admitting: Family Medicine

## 2017-03-20 ENCOUNTER — Encounter: Payer: Self-pay | Admitting: Family Medicine

## 2017-03-20 VITALS — BP 120/72 | HR 71 | Temp 98.2°F | Ht 72.0 in | Wt 203.4 lb

## 2017-03-20 DIAGNOSIS — R5383 Other fatigue: Secondary | ICD-10-CM | POA: Diagnosis not present

## 2017-03-20 DIAGNOSIS — R6883 Chills (without fever): Secondary | ICD-10-CM

## 2017-03-20 DIAGNOSIS — R011 Cardiac murmur, unspecified: Secondary | ICD-10-CM | POA: Diagnosis not present

## 2017-03-20 DIAGNOSIS — R001 Bradycardia, unspecified: Secondary | ICD-10-CM

## 2017-03-20 NOTE — Progress Notes (Addendum)
HPI:   Acute visit for many complaints.  He says for the last few weeks he has felt fatigued, has some body aches at times, chills at times, dry skin and a low heart rate when he checks it on his apple watch.  Reports his heart rate has been running in the 50s.  Reports quite active. He feels bad when it is running higher or lower.  He denies chest pain, shortness of breath, dyspnea on exertion, palpitations, fevers, unexplained weight loss, cough, syncope or dizziness.  However, he reports he has an uneasiness in his chest.  He is worried about his thyroid.  He reports this all started a few weeks ago when he had a sinus infection that was treated with doxycycline.  Sinus issues resolved, but the other symptoms have remained.  ROS: See pertinent positives and negatives per HPI.  Past Medical History:  Diagnosis Date  . Allergy   . GERD (gastroesophageal reflux disease)   . History of chickenpox   . Hypertension     Past Surgical History:  Procedure Laterality Date  . TONSILLECTOMY AND ADENOIDECTOMY      Family History  Problem Relation Age of Onset  . Hypertension Mother   . Cancer Maternal Aunt        great aunt had kidney cancer  . Cancer Paternal Uncle        great uncle had prostate cancer  . Hypertension Maternal Grandmother   . Diabetes Maternal Grandmother   . Cancer Maternal Grandmother        kidney  . Heart disease Paternal Grandfather     Social History   Socioeconomic History  . Marital status: Married    Spouse name: [name]  . Number of children: 2  . Years of education: None  . Highest education level: None  Social Needs  . Financial resource strain: None  . Food insecurity - worry: None  . Food insecurity - inability: None  . Transportation needs - medical: None  . Transportation needs - non-medical: None  Occupational History  . None  Tobacco Use  . Smoking status: Former Research scientist (life sciences)  . Smokeless tobacco: Never Used  Substance and Sexual Activity   . Alcohol use: Yes  . Drug use: No  . Sexual activity: Yes  Other Topics Concern  . None  Social History Narrative   Self employed   Two children - ages 66 and 67     Current Outpatient Medications:  .  cetirizine (ZYRTEC) 10 MG tablet, Take 10 mg by mouth daily., Disp: , Rfl:  .  valsartan (DIOVAN) 80 MG tablet, Take 1 tablet (80 mg total) by mouth daily., Disp: 90 tablet, Rfl: 1  EXAM:  Vitals:   03/20/17 1415  BP: 120/72  Pulse: 71  Temp: 98.2 F (36.8 C)  SpO2: 98%    Body mass index is 27.59 kg/m.  GENERAL: vitals reviewed and listed above, alert, oriented, appears well hydrated and in no acute distress  HEENT: atraumatic, conjunttiva clear, no obvious abnormalities on inspection of external nose and ears  NECK: no obvious masses on inspection  LUNGS: clear to auscultation bilaterally, no wheezes, rales or rhonchi, good air movement  CV: HRRR, II/VII systolic ejection murmur,  no peripheral edema  MS: moves all extremities without noticeable abnormality  PSYCH: pleasant and cooperative, no obvious depression or anxiety  ASSESSMENT AND PLAN:  Discussed the following assessment and plan:  Fatigue, unspecified type - Plan: Comprehensive metabolic panel, CBC with Differential/Platelet, TSH, Vitamin  B12, Hemoglobin A1c, ECHOCARDIOGRAM COMPLETE  Bradycardia - Plan: EKG 12-Lead, ECHOCARDIOGRAM COMPLETE  Chills  Heart murmur - Plan: ECHOCARDIOGRAM COMPLETE   -we discussed possible serious and likely etiologies, workup and treatment, treatment risks and return precautions -EKG with NSR -echo pending given his reported symptoms and heart murmur, vitals normal today, emergency precautions discussed -labs per orders -close follow up with PCP advised -Patient advised to return or notify a doctor immediately if symptoms worsen or persist or new concerns arise.  Patient Instructions  BEFORE YOU LEAVE: -labs -follow up: 1-2 weeks with PCP  -We placed a  referral for you as discussed for the echo of the heart. It usually takes about 1-2 weeks to process and schedule this referral. If you have not heard from [place] regarding this appointment in 2 weeks please contact our office.     Colin Benton R., DO

## 2017-03-20 NOTE — Patient Instructions (Signed)
BEFORE YOU LEAVE: -labs -follow up: 1-2 weeks with PCP  -We placed a referral for you as discussed for the echo of the heart. It usually takes about 1-2 weeks to process and schedule this referral. If you have not heard from [place] regarding this appointment in 2 weeks please contact our office.

## 2017-03-21 LAB — VITAMIN B12: Vitamin B-12: 293 pg/mL (ref 211–911)

## 2017-03-21 LAB — CBC WITH DIFFERENTIAL/PLATELET
BASOS PCT: 1.1 % (ref 0.0–3.0)
Basophils Absolute: 0.1 10*3/uL (ref 0.0–0.1)
EOS PCT: 3.4 % (ref 0.0–5.0)
Eosinophils Absolute: 0.3 10*3/uL (ref 0.0–0.7)
HCT: 46.1 % (ref 39.0–52.0)
Hemoglobin: 15.7 g/dL (ref 13.0–17.0)
LYMPHS ABS: 2.6 10*3/uL (ref 0.7–4.0)
Lymphocytes Relative: 35 % (ref 12.0–46.0)
MCHC: 34.1 g/dL (ref 30.0–36.0)
MCV: 92.2 fl (ref 78.0–100.0)
MONO ABS: 0.7 10*3/uL (ref 0.1–1.0)
MONOS PCT: 9.5 % (ref 3.0–12.0)
NEUTROS PCT: 51 % (ref 43.0–77.0)
Neutro Abs: 3.8 10*3/uL (ref 1.4–7.7)
Platelets: 235 10*3/uL (ref 150.0–400.0)
RBC: 5 Mil/uL (ref 4.22–5.81)
RDW: 11.9 % (ref 11.5–15.5)
WBC: 7.4 10*3/uL (ref 4.0–10.5)

## 2017-03-21 LAB — COMPREHENSIVE METABOLIC PANEL
ALBUMIN: 4.6 g/dL (ref 3.5–5.2)
ALT: 34 U/L (ref 0–53)
AST: 21 U/L (ref 0–37)
Alkaline Phosphatase: 57 U/L (ref 39–117)
BUN: 15 mg/dL (ref 6–23)
CALCIUM: 9.5 mg/dL (ref 8.4–10.5)
CO2: 32 mEq/L (ref 19–32)
CREATININE: 1.14 mg/dL (ref 0.40–1.50)
Chloride: 103 mEq/L (ref 96–112)
GFR: 74.59 mL/min (ref >60.00)
Glucose, Bld: 101 mg/dL — ABNORMAL HIGH (ref 70–99)
Potassium: 3.9 mEq/L (ref 3.5–5.1)
Sodium: 141 mEq/L (ref 135–145)
Total Bilirubin: 0.6 mg/dL (ref 0.2–1.2)
Total Protein: 7.4 g/dL (ref 6.0–8.3)

## 2017-03-21 LAB — HEMOGLOBIN A1C: Hgb A1c MFr Bld: 5.3 % (ref 4.6–6.5)

## 2017-03-21 LAB — TSH: TSH: 1.5 u[IU]/mL (ref 0.35–4.50)

## 2017-03-26 ENCOUNTER — Other Ambulatory Visit (HOSPITAL_COMMUNITY): Payer: 59

## 2017-03-31 ENCOUNTER — Telehealth (HOSPITAL_COMMUNITY): Payer: Self-pay | Admitting: Family Medicine

## 2017-03-31 NOTE — Telephone Encounter (Signed)
-----   Message from Lucretia Kern, DO sent at 03/27/2017  9:25 PM EST ----- Regarding: FW: echocardiogram Would advise he schedule follow up with his PCP to discuss or see a cardiologist about the symptoms he was having. Ok to place referral. Thanks. ----- Message ----- From: Gretta Began Sent: 03/26/2017   9:49 AM To: Lucretia Kern, DO Subject: echocardiogram                                 Just an FYI, The echocardiogram was scheduled as ASAP. We spoke with patient to schedule but the patient says test is too expensive and wants to talk to you.

## 2017-03-31 NOTE — Telephone Encounter (Signed)
03/21/17 Patient (patient called back and stated that the test was going to be too expensive and wanted to hold off until he spoke with doctor.).Marland KitchenRG  03/26/17 notified Dr. Maudie Mercury of patient's decision

## 2017-03-31 NOTE — Telephone Encounter (Signed)
I called the pt and informed him of the message below.  Patient stated he will think about this and call back. 

## 2017-05-16 ENCOUNTER — Ambulatory Visit: Payer: 59 | Admitting: Nurse Practitioner

## 2017-05-16 ENCOUNTER — Encounter: Payer: Self-pay | Admitting: Nurse Practitioner

## 2017-05-16 VITALS — BP 128/84 | HR 77 | Temp 97.5°F | Ht 72.0 in | Wt 202.0 lb

## 2017-05-16 DIAGNOSIS — J0100 Acute maxillary sinusitis, unspecified: Secondary | ICD-10-CM

## 2017-05-16 DIAGNOSIS — R6889 Other general symptoms and signs: Secondary | ICD-10-CM

## 2017-05-16 LAB — POC INFLUENZA A&B (BINAX/QUICKVUE)
INFLUENZA A, POC: NEGATIVE
INFLUENZA B, POC: NEGATIVE

## 2017-05-16 MED ORDER — AZITHROMYCIN 250 MG PO TABS: 250.0000 mg | ORAL_TABLET | Freq: Every day | ORAL | 0 refills | Status: DC

## 2017-05-16 MED ORDER — FLUTICASONE PROPIONATE 50 MCG/ACT NA SUSP: 2.0000 | Freq: Every day | NASAL | 0 refills | Status: DC

## 2017-05-16 NOTE — Patient Instructions (Addendum)
Negative influenza test.  Consider CT or x-ray sinus if recurrent sinusitis within the next 45months.  May continue with saline sinus irrigation once a day as needed.  May use mucinex 600mg  as directed on package.  Encourage adequate oral hydration.

## 2017-05-16 NOTE — Progress Notes (Signed)
Subjective:  Patient ID: Daniel Mata, male    DOB: March 17, 1976  Age: 42 y.o. MRN: [id number]  CC: Nasal Congestion (congestion,joints ache,weak/1 wk)   Sinusitis  This is a recurrent problem. The current episode started in the past 7 days. The problem has been gradually worsening since onset. There has been no fever. Associated symptoms include congestion, coughing, ear pain, headaches, a hoarse voice and sinus pressure. Pertinent negatives include no chills, neck pain, shortness of breath, sneezing, sore throat or swollen glands. Past treatments include saline sprays. The treatment provided no relief.   Outpatient Medications Prior to Visit  Medication Sig Dispense Refill  . cetirizine (ZYRTEC) 10 MG tablet Take 10 mg by mouth daily.    . valsartan (DIOVAN) 80 MG tablet Take 1 tablet (80 mg total) by mouth daily. 90 tablet 1   No facility-administered medications prior to visit.     ROS See HPI  Objective:  BP 128/84   Pulse 77   Temp (!) 97.5 F (36.4 C)   Ht 6' (1.829 m)   Wt 202 lb (91.6 kg)   SpO2 97%   BMI 27.40 kg/m   BP Readings from Last 3 Encounters:  05/16/17 128/84  03/20/17 120/72  03/06/17 128/88    Wt Readings from Last 3 Encounters:  05/16/17 202 lb (91.6 kg)  03/20/17 203 lb 6.4 oz (92.3 kg)  03/06/17 203 lb (92.1 kg)    Physical Exam  Constitutional: He is oriented to person, place, and time. No distress.  HENT:  Right Ear: Tympanic membrane, external ear and ear canal normal.  Left Ear: Tympanic membrane, external ear and ear canal normal.  Nose: Mucosal edema present. No rhinorrhea. Right sinus exhibits maxillary sinus tenderness. Right sinus exhibits no frontal sinus tenderness. Left sinus exhibits maxillary sinus tenderness. Left sinus exhibits no frontal sinus tenderness.  Mouth/Throat: Uvula is midline.  Eyes: No scleral icterus.  Neck: Normal range of motion. Neck supple.  Cardiovascular: Normal rate and regular rhythm.  Pulmonary/Chest:  Effort normal and breath sounds normal.  Lymphadenopathy:    He has cervical adenopathy.  Neurological: He is alert and oriented to person, place, and time.  Vitals reviewed.   Lab Results  Component Value Date   WBC 7.4 03/20/2017   HGB 15.7 03/20/2017   HCT 46.1 03/20/2017   PLT 235.0 03/20/2017   GLUCOSE 101 (H) 03/20/2017   CHOL 192 10/01/2016   TRIG 99.0 10/01/2016   HDL 58.80 10/01/2016   LDLCALC 114 (H) 10/01/2016   ALT 34 03/20/2017   AST 21 03/20/2017   NA 141 03/20/2017   K 3.9 03/20/2017   CL 103 03/20/2017   CREATININE 1.14 03/20/2017   BUN 15 03/20/2017   CO2 32 03/20/2017   TSH 1.50 03/20/2017   HGBA1C 5.3 03/20/2017    Dg Chest 2 View  Result Date: 06/02/2015 CLINICAL DATA:  Follow-up of pneumonia ; patient on antibiotics for the past 3 days; persistent cough, shortness of breath, and chest soreness. Patient is a former smoker. EXAM: CHEST  2 VIEW COMPARISON:  PA and lateral chest x-ray of April 14, 2014 PA chest x-ray of April 13, 2009 FINDINGS: The lungs are well-expanded. The interstitial markings within both lungs are increased. There is no alveolar infiltrate. There is no pleural effusion. There is no pneumothorax or pneumomediastinum. The heart and pulmonary vascularity are normal. The bony thorax exhibits no acute abnormality. IMPRESSION: Mild diffuse interstitial prominence has developed since the January 2016 study. This may  reflect an interstitial pneumonia or pneumonitis type process. There is no alveolar pneumonia. Followup PA and lateral chest X-ray is recommended in 3-4 weeks following trial of antibiotic therapy to ensure resolution. If the patient's symptoms do not respond to the current antibiotic therapy, chest CT scanning would be the most useful next imaging step. Electronically Signed   By: Shed  Martinique M.D.   On: 06/02/2015 11:27    Assessment & Plan:   Daniel Mata was seen today for nasal congestion.  Diagnoses and all orders for this  visit:  Flu-like symptoms -     POC Influenza A&B(BINAX/QUICKVUE)  Subacute maxillary sinusitis -     azithromycin (ZITHROMAX Z-PAK) 250 MG tablet; Take 1 tablet (250 mg total) by mouth daily. Take 2tabs on first day, then 1tab once a day till complete -     fluticasone (FLONASE) 50 MCG/ACT nasal spray; Place 2 sprays into both nostrils daily.   I am having Daniel Mata start on azithromycin and fluticasone. I am also having him maintain his cetirizine and valsartan.  Meds ordered this encounter  Medications  . azithromycin (ZITHROMAX Z-PAK) 250 MG tablet    Sig: Take 1 tablet (250 mg total) by mouth daily. Take 2tabs on first day, then 1tab once a day till complete    Dispense:  6 tablet    Refill:  0    Order Specific Question:   Supervising Provider    Answer:   Lucille Passy [[id number]]  . fluticasone (FLONASE) 50 MCG/ACT nasal spray    Sig: Place 2 sprays into both nostrils daily.    Dispense:  16 g    Refill:  0    Order Specific Question:   Supervising Provider    Answer:   Lucille Passy [[id number]]    Follow-up: No Follow-up on file.  Wilfred Lacy, NP

## 2017-06-07 ENCOUNTER — Other Ambulatory Visit: Payer: Self-pay | Admitting: Adult Health

## 2017-06-10 NOTE — Telephone Encounter (Signed)
Sent to the pharmacy by e-scribe. 

## 2017-09-07 ENCOUNTER — Other Ambulatory Visit: Payer: Self-pay | Admitting: Adult Health

## 2017-09-09 NOTE — Telephone Encounter (Signed)
Sent to the pharmacy by e-scribe. 

## 2017-12-05 ENCOUNTER — Other Ambulatory Visit: Payer: Self-pay | Admitting: Adult Health

## 2017-12-05 NOTE — Telephone Encounter (Signed)
#  30 Sent to the pharmacy by e-scribe.  Pt past due for cpx.

## 2018-02-09 ENCOUNTER — Telehealth: Payer: Self-pay | Admitting: Adult Health

## 2018-02-10 NOTE — Telephone Encounter (Signed)
Denied.  Past due for yearly CPX.

## 2018-02-18 MED ORDER — VALSARTAN 80 MG PO TABS: 80.0000 mg | ORAL_TABLET | Freq: Every day | ORAL | 0 refills | Status: DC

## 2018-02-18 NOTE — Telephone Encounter (Signed)
Patient scheduled cpe for 03/31/2018 at 8am and wants to know if he can have it filled until the appointment?

## 2018-02-18 NOTE — Addendum Note (Signed)
Addended by: Miles Costain T on: 02/18/2018 10:38 AM   Modules accepted: Orders

## 2018-02-18 NOTE — Telephone Encounter (Signed)
Sent to the pharmacy by e-scribe. 

## 2018-03-31 ENCOUNTER — Encounter: Payer: 59 | Admitting: Adult Health

## 2018-04-08 ENCOUNTER — Other Ambulatory Visit: Payer: Self-pay | Admitting: Adult Health

## 2018-04-09 ENCOUNTER — Encounter: Payer: Self-pay | Admitting: Family Medicine

## 2018-04-09 NOTE — Telephone Encounter (Signed)
Sent to the pharmacy by e-scribe for 30 days..  Letter sent to the pt.  Nothing further needed.

## 2018-04-24 ENCOUNTER — Other Ambulatory Visit: Payer: Self-pay | Admitting: Adult Health

## 2018-04-27 ENCOUNTER — Other Ambulatory Visit: Payer: Self-pay | Admitting: Adult Health

## 2018-04-28 NOTE — Telephone Encounter (Signed)
DENIED.  NEEDS AN APPOINTMENT WITH [facility] FOR [facility]AR[facility] AND LABS.

## 2018-07-04 ENCOUNTER — Other Ambulatory Visit: Payer: Self-pay | Admitting: Adult Health

## 2018-07-06 NOTE — Telephone Encounter (Signed)
Denied. Needs appt

## 2018-08-02 ENCOUNTER — Other Ambulatory Visit: Payer: Self-pay | Admitting: Adult Health

## 2018-08-03 NOTE — Telephone Encounter (Signed)
DENIED.  NEEDS AN OFFICE VISIT.

## 2023-11-10 ENCOUNTER — Emergency Department (HOSPITAL_BASED_OUTPATIENT_CLINIC_OR_DEPARTMENT_OTHER)
Admission: EM | Admit: 2023-11-10 | Discharge: 2023-11-10 | Disposition: A | Discharge status: Home / Self Care | Attending: Emergency Medicine | Admitting: Emergency Medicine

## 2023-11-10 ENCOUNTER — Other Ambulatory Visit: Payer: Self-pay

## 2023-11-10 ENCOUNTER — Emergency Department (HOSPITAL_BASED_OUTPATIENT_CLINIC_OR_DEPARTMENT_OTHER)

## 2023-11-10 ENCOUNTER — Encounter (HOSPITAL_BASED_OUTPATIENT_CLINIC_OR_DEPARTMENT_OTHER): Payer: Self-pay

## 2023-11-10 DIAGNOSIS — R519 Headache, unspecified: Secondary | ICD-10-CM | POA: Insufficient documentation

## 2023-11-10 DIAGNOSIS — M542 Cervicalgia: Secondary | ICD-10-CM | POA: Diagnosis present

## 2023-11-10 LAB — CBC WITH DIFFERENTIAL/PLATELET
Abs Immature Granulocytes: 0.01 K/uL (ref 0.00–0.07)
Basophils Absolute: 0 K/uL (ref 0.0–0.1)
Basophils Relative: 1 %
Eosinophils Absolute: 0.2 K/uL (ref 0.0–0.5)
Eosinophils Relative: 4 %
HCT: 43.9 % (ref 39.0–52.0)
Hemoglobin: 15.4 g/dL (ref 13.0–17.0)
Immature Granulocytes: 0 %
Lymphocytes Relative: 43 %
Lymphs Abs: 2.5 K/uL (ref 0.7–4.0)
MCH: 31.5 pg (ref 26.0–34.0)
MCHC: 35.1 g/dL (ref 30.0–36.0)
MCV: 89.8 fL (ref 80.0–100.0)
Monocytes Absolute: 0.5 K/uL (ref 0.1–1.0)
Monocytes Relative: 8 %
Neutro Abs: 2.6 K/uL (ref 1.7–7.7)
Neutrophils Relative %: 44 %
Platelets: 205 K/uL (ref 150–400)
RBC: 4.89 MIL/uL (ref 4.22–5.81)
RDW: 11.7 % (ref 11.5–15.5)
WBC: 5.8 K/uL (ref 4.0–10.5)
nRBC: 0 % (ref 0.0–0.2)

## 2023-11-10 LAB — BASIC METABOLIC PANEL WITH GFR
Anion gap: 11 (ref 5–15)
BUN: 12 mg/dL (ref 6–20)
CO2: 25 mmol/L (ref 22–32)
Calcium: 10.1 mg/dL (ref 8.9–10.3)
Chloride: 103 mmol/L (ref 98–111)
Creatinine, Ser: 0.95 mg/dL (ref 0.61–1.24)
GFR, Estimated: >60 mL/min (ref >60)
Glucose, Bld: 100 mg/dL — ABNORMAL HIGH (ref 70–99)
Potassium: 4.6 mmol/L (ref 3.5–5.1)
Sodium: 139 mmol/L (ref 135–145)

## 2023-11-10 MED ORDER — SODIUM CHLORIDE 0.9 % IV SOLN: INTRAVENOUS | Status: DC

## 2023-11-10 MED ORDER — DIAZEPAM 5 MG/ML IJ SOLN
2.5000 mg | Freq: Once | INTRAMUSCULAR | Status: DC
  Filled 2023-11-10: qty 2

## 2023-11-10 MED ORDER — SODIUM CHLORIDE 0.9 % IV BOLUS
1000.0000 mL | Freq: Once | INTRAVENOUS | Status: AC
  Administered 2023-11-10 (×2): 1000 mL via INTRAVENOUS

## 2023-11-10 MED ORDER — KETOROLAC TROMETHAMINE 30 MG/ML IJ SOLN
15.0000 mg | Freq: Once | INTRAMUSCULAR | Status: AC
  Administered 2023-11-10 (×2): 15 mg via INTRAVENOUS
  Filled 2023-11-10: qty 1

## 2023-11-10 MED ORDER — IOHEXOL 350 MG/ML SOLN
100.0000 mL | Freq: Once | INTRAVENOUS | Status: AC | PRN
  Administered 2023-11-10 (×2): 80 mL via INTRAVENOUS

## 2023-11-10 MED ORDER — MORPHINE SULFATE (PF) 4 MG/ML IV SOLN
4.0000 mg | Freq: Once | INTRAVENOUS | Status: DC
  Filled 2023-11-10: qty 1

## 2023-11-10 MED ORDER — METAXALONE 800 MG PO TABS: 800.0000 mg | ORAL_TABLET | Freq: Three times a day (TID) | ORAL | 0 refills | Status: DC

## 2023-11-10 NOTE — ED Notes (Signed)
 Pt still requests no additional medications at this time

## 2023-11-10 NOTE — ED Notes (Signed)
 Pt advises he would like to wait on administration of morphine  & valium . Will reassess for need & administer per request

## 2023-11-10 NOTE — ED Notes (Signed)
 Takes testosterone  sublingual.

## 2023-11-10 NOTE — ED Notes (Signed)
 Patient transported to CT via wheelchair; still request no additional meds at this time

## 2023-11-10 NOTE — ED Provider Notes (Addendum)
 [place] [facility] AT [facility] Provider Note   CSN: [id number] Arrival date & time: 11/10/23  1106     Patient presents with: Headache   Daniel Mata is a 48 y.o. male.   48 year old male presents with several days of pain at his upper neck radiating into his head.  States that symptoms with gradual onset.  They been associate with dizziness and nausea.  Has been using ibuprofen and Benadryl without relief.  Has had occasional jaw pain.  No associated focal neurological weakness.  No photophobia or fevers.  Denies any cardiac symptomatology.  No real URI symptoms.  No history of trauma.  No prior history of same       Prior to Admission medications   Medication Sig Start Date End Date Taking? Authorizing Provider  cetirizine (ZYRTEC) 10 MG tablet Take 10 mg by mouth daily.   Yes [provider]  valsartan  (DIOVAN ) 80 MG tablet TAKE 1 TABLET (80 MG TOTAL) BY MOUTH DAILY. **NEEDS YEARLY PHYSICAL** Patient taking differently: Take 20 mg by mouth daily. **NEEDS YEARLY PHYSICAL** 04/09/18  Yes Nafziger, Darleene, NP  azithromycin  (ZITHROMAX  Z-PAK) 250 MG tablet Take 1 tablet (250 mg total) by mouth daily. Take 2tabs on first day, then 1tab once a day till complete 05/16/17   Nche, Roselie Rockford, NP    Allergies: Histamine, Nutmeg oil (myristica oil), Other, and Shrimp [shellfish allergy]    Review of Systems  All other systems reviewed and are negative.   Updated Vital Signs BP (!) 131/98 (BP Location: Right Arm)   Pulse (!) 54   Temp 98.2 F (36.8 C) (Oral)   Resp 15   Ht 1.829 m (6')   Wt 90.7 kg   SpO2 100%   BMI 27.12 kg/m   Physical Exam Vitals and nursing note reviewed.  Constitutional:      General: He is not in acute distress.    Appearance: Normal appearance. He is well-developed. He is not toxic-appearing.  HENT:     Head: Normocephalic and atraumatic.  Eyes:     General: Lids are normal.     Conjunctiva/sclera: Conjunctivae  normal.     Pupils: Pupils are equal, round, and reactive to light.  Neck:     Thyroid : No thyroid  mass.     Trachea: No tracheal deviation.  Cardiovascular:     Rate and Rhythm: Normal rate and regular rhythm.     Heart sounds: Normal heart sounds. No murmur heard.    No gallop.  Pulmonary:     Effort: Pulmonary effort is normal. No respiratory distress.     Breath sounds: Normal breath sounds. No stridor. No decreased breath sounds, wheezing, rhonchi or rales.  Abdominal:     General: There is no distension.     Palpations: Abdomen is soft.     Tenderness: There is no abdominal tenderness. There is no rebound.  Musculoskeletal:        General: No tenderness. Normal range of motion.     Cervical back: Normal range of motion and neck supple.  Skin:    General: Skin is warm and dry.     Findings: No abrasion or rash.  Neurological:     General: No focal deficit present.     Mental Status: He is alert and oriented to person, place, and time. Mental status is at baseline.     GCS: GCS eye subscore is 4. GCS verbal subscore is 5. GCS motor subscore is 6.  Cranial Nerves: No cranial nerve deficit.     Sensory: No sensory deficit.     Motor: Motor function is intact.  Psychiatric:        Attention and Perception: Attention normal.        Speech: Speech normal.        Behavior: Behavior normal.     (all labs ordered are listed, but only abnormal results are displayed) Labs Reviewed - No data to display  EKG: EKG Interpretation Date/Time:  Monday November 10 2023 11:23:15 EDT Ventricular Rate:  72 PR Interval:  140 QRS Duration:  92 QT Interval:  376 QTC Calculation: 411 R Axis:   32  Text Interpretation: Normal sinus rhythm Junctional ST depression, probably normal Borderline ECG When compared with ECG of 14-Apr-2014 18:35, PREVIOUS ECG IS PRESENT Confirmed by Dasie Faden ([id number]) on 11/10/2023 1:28:38 PM  Radiology: No results found.   Procedures   Medications  Ordered in the ED  0.9 %  sodium chloride  infusion (has no administration in time range)  sodium chloride  0.9 % bolus 1,000 mL (has no administration in time range)  diazepam  (VALIUM ) injection 2.5 mg (has no administration in time range)  morphine  (PF) 4 MG/ML injection 4 mg (has no administration in time range)                                    Medical Decision Making Amount and/or Complexity of Data Reviewed Labs: ordered. Radiology: ordered. ECG/medicine tests: ordered.  Risk Prescription drug management.  EKG showed normal sinus rhythm.  No signs of acute ischemic changes.  Low suspicion for patient's current symptoms represent ACS. Patient given IV fluids here.  Offered analgesics which initially declined.  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  Some focal atherosclerotic calcification on the right M1 segment was noted.  Patient and wife was at bedside told to follow-up with her doctor for this.  Patient will be given Toradol  suspect that he has some migraine component to his current symptoms.  Patient has no nuchal rigidity he has no photophobia.  Low suspicion for subarachnoid or meningitis.  Will discharge home with return precautions given     Final diagnoses:  None    ED Discharge Orders     None          Dasie Faden, MD 11/10/23 1540    Dasie Faden, MD 11/10/23 1542

## 2023-11-10 NOTE — ED Triage Notes (Addendum)
 Severe bilateral shoulder, neck, head pain onset Thursday and worsening over the last 2 days. Onset dizziness and nausea yesterday. Took ibuprofen yesterday and benadryl 25 this am without results. Also complaining of jaw pain.

## 2023-11-10 NOTE — Discharge Instructions (Signed)
 Use the muscle relaxant as prescribed for your neck discomfort.  Follow-up with your doctor about the CAT scan results as we discussed.

## 2024-01-08 NOTE — ED Provider Notes (Signed)
 [facility]  ED Provider Note  Daniel Mata 48 y.o. male DOB: 1975/08/19 MRN: [id number] History   Chief Complaint  Patient presents with  . Chest Pain    Had a CT scan x1 day PTA that showed mild dilation of the ascending aorta. Returns today due to reports of lightheadedness, CP non radiating to the back, hoarseness of voice. Denies dyspnea.    CT Calcium Score yesterday  1. The Agatston CAC score is  0. This places the patient between the 0 and 25th percentile for age and gender based on the [facility] data base. 2. Mild aneurysmal dilatation of the ascending aorta..  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 He is c/o CP nonradiating not associated with nausea or vomiting diaphoresis or pallor.   History provided by:  Patient Language interpreter used: No    Patient past medical history of hypertension and GERD presents to the ED for some chest pain that started this morning.  He had a CT calcium score yesterday that had an incidental finding of a mild aneurysm aneurysm at 4.2 cm.  In addition to this patient has had neck tightness and headache accompanied with occasional lightheadedness for the past few months.  He stated he had a CT and CTA of head and neck just a month ago.  Patient denied any vertigo and stated that it was more lightheadedness.  Patient denies any acute vision changes, shortness of breath, fever, focal neurologic deficits, trauma, being on blood thinners.    Past Medical History:  Diagnosis Date  . GERD (gastroesophageal reflux disease)   . Hypertension     Past Surgical History:  Procedure Laterality Date  . Tonsillectomy      Social History   Substance and Sexual Activity  Alcohol Use Yes  . Alcohol/week: 1.0 - 2.0 standard drink of alcohol  . Types: 1 - 2 Cans of beer per week   Tobacco Use History[1] E-Cigarettes  . Vaping Use    . Start Date    .  Cartridges/Day    . Quit Date     Social History   Substance and Sexual Activity  Drug Use No         Allergies[2]  Discharge Medication List as of 01/09/2024 12:15 AM     CONTINUE these medications which have NOT CHANGED   Details  Cetirizine HCl (ZYRTEC PO) Take by mouth., Until Discontinued, Historical Med    HYDROcodone -acetaminophen  (NORCO) 5-325 mg per tablet Take 1 tablet by mouth every 6 (six) hours as needed for Pain., Starting 04/15/2013, Until Discontinued, Print    LISINOPRIL  PO Take by mouth., Until Discontinued, Historical Med    pantoprazole  (PROTONIX ) 40 MG tablet Starting 02/22/2013, Until Discontinued, Historical Med    predniSONE  (DELTASONE ) 20 mg tablet 4 the first day, 3 the next, 2 the third and 1 the last day, Normal        Primary Survey  Primary Survey  Review of Systems   Review of Systems  All relevant ROS reviewed as seen in HPI above.  Physical Exam   ED Triage Vitals [01/08/24 1709]  BP (!) 134/94  Heart Rate 82  Resp 17  SpO2 100 %  Temp 98.6 F (37 C)    Physical Exam  Neck: Muscular tenderness. No spinous process tenderness. Normal range of motion.  Musculoskeletal:     Cervical back: Muscular tenderness present. No spinous process tenderness. Normal range  of motion.   Neurological: He is alert and oriented to person, place, and time. He has normal speech.    Patient is Well-appearing, in no acute distress, and appears stated age.  Vitals within normal limits.  Patient's heart sounds are normal, normal rate and rhythm, no peripheral edema noted, intact pulses in all 4 extremities.   Breath sounds normal lungs were clear to auscultation bilaterally.    ED Course   Lab results:   CBC AND DIFFERENTIAL - Abnormal      Result Value   WBC 10.7 (*)    RBC 4.97     HGB 15.6     HCT 44.0     MCV 88.5     MCH 31.4     MCHC 35.5     Plt Ct 234     RDW SD 36.0     MPV 10.4     NRBC% 0.0     Absolute NRBC Count  0.00     NEUTROPHIL % 51.4     LYMPHOCYTE % 39.7     MONOCYTE % 7.1     Eosinophil % 1.1     BASOPHIL % 0.5     IG% 0.2     ABSOLUTE NEUTROPHIL COUNT 5.53     ABSOLUTE LYMPHOCYTE COUNT 4.26     Absolute Monocyte Count 0.76     Absolute Eosinophil Count 0.12     Absolute Basophil Count 0.05     Absolute Immature Granulocyte Count 0.02    COMPREHENSIVE METABOLIC PANEL - Abnormal   Na 139     Potassium 4.1     Cl 101     CO2 24     AGAP 14     Glucose 114 (*)    BUN 18     Creatinine 0.90     Ca 10.1     ALK PHOS 73     T Bili 0.6     Total Protein 8.0     Alb 4.9     GLOBULIN 3.1     ALBUMIN/GLOBULIN RATIO 1.6     BUN/CREAT RATIO 20.0     ALT 34     AST 28     eGFR 105     Comment: Normal GFR (glomerular filtration rate) > 60 mL/min/1.73 meters squared, < 60 may include impaired kidney function. Calculation based on the Chronic Kidney Disease Epidemiology Collaboration (CK-EPI)equation refit without adjustment for race.  COVID-19, FLU A+B AND RSV - Normal   Flu A Negative     Flu B Negative     RSV PCR Negative     SARS-COV-2 Not Detected     Narrative:    SARS-COV-2 (COVID-19)PCR-Negative results do not preclude SARS-CoV-2 infection and should not be used as the sole basis for patient management decisions. Negative results must be combined with clinical observations, patient history, and epidemiological information.  Flu and/or RSV - Negative results do not preclude the presence of Flu or RSV virus and should not be used as the sole basis for treatment or other patient management decisions. False negative results may occur if virus is present at levels below the analytical limit of detection.  This test detects Influenza A, Influenza B, and Respiratory Syncytial Virus and SARS-COV-2 (COVID-19) by PCR.     GEN5 CARDIAC TROPONIN T (TNT5) BASELINE - Normal   TnT-Gen5 (0hr) 5     Comment: Assay results < 6 ng/L and > 10,000 ng/L are reported as 5 ng/L and 10,001  ng/L  respectively to reflect the absolute reportable range of TnTGen5.  An elevated Troponin indicates myocardial damage. Elevated troponin may also be due to pulmonary emboli, aortic dissection, heart failure, trauma, toxins and ischemia in the setting of critical illness.   NT-PROBNP - Normal   NT-ProBNP <36     Comment: Among patients with dyspnea, NT-proBNPis highly sensitive for the detection of acute congestive heart failure.  In addition, a NT-proBNP <300 pg/mL effectively rules out acute congestive heart failure, with 98% negative predictive value.   D-DIMER, QUANTITATIVE - Normal   D-Dimer <0.27     Comment: The results of D-Dimer testing should be evaluated in the context of all clinical and laboratory data available. D-Dimer cut off value is 0.50 ug/ml FEU. D-Dimer assay can be used to exclude DVT & PE, but should not be used to exclude patients with: - Therapeutic dose anticoagulant therapy for >24 hours.  - Fibrinolytic therapy within previous 7 days  - Trauma or surgery within previous 4 weeks  - Disseminated malignancies  - Aortic aneurysm  - Sepsis, severe infections, pneumonia, severe skin infections  - Liver cirrhosis  - Pregnancy   GEN5 CARDIAC TROPONIN T(TNT5) 1 HOUR - Normal   TnT-Gen5 (1hr) 5     Comment: Assay results < 6 ng/L and > 10,000 ng/L are reported as 5 ng/L and 10,001 ng/L respectively to reflect the absolute reportable range of TnTGen5.  An elevated Troponin indicates myocardial damage. Elevated troponin may also be due to pulmonary emboli, aortic dissection, heart failure, trauma, toxins and ischemia in the setting of critical illness.   Delta 1 Hour 0      Imaging:   XR CHEST AP PORTABLE   Narrative:    HISTORY:  Chest Pain  TECHNIQUE: Single view chest  COMPARISON: None  FINDINGS:  -Heart size is within normal limits.  -The lungs are clear.  -No acute osseous findings.       Impression:    IMPRESSION: No plain radiographic evidence of  acute cardiopulmonary disease  Electronically Signed by: Venetia Smalling, MD on 01/08/2024 6:14 PM  CT ANGIO CHEST AORTA   Narrative:    CT pulmonary angiogram:  INDICATION: Chest pain  TECHNIQUE: Thin axial scans were performed through the chest after intravenous injection of 80 mL of Isovue-370. Maximum intensity projection images were generated on an independent workstation and archived to PACS. Radiation dose reduction was utilized (automated exposure control, mA or kV adjustment based on patient size, or iterative image reconstruction).  COMPARISON: 01/07/2024  FINDINGS:   Pulmonary arteries: No pulmonary emboli. Thoracic aorta: No aneurysm or dissection. Mediastinal and hilar structures:  No mass or adenopathy. Lungs: No significant abnormality.  There is no acute infiltrate. There is no pleural effusion.  The heart is not enlarged. There are no coronary artery calcifications.  On the current examination the maximal AP diameter of the ascending aorta is 4.0 cm which is minimally dilated.   Bony structures: Unremarkable.   Visualized upper abdomen: Unremarkable    Impression:    IMPRESSION:  No pulmonary emboli. No acute infiltrate or pleural effusion.  On the current examination the maximal AP diameter of the ascending aorta is 4.0 cm which is minimally dilated.     Electronically Signed by: Marinda Fleming, MD on 01/08/2024 11:23 PM     ECG: ECG Results          ECG 12 lead (In process)  Result time 01/08/24 17:43:37    In process  Narrative:   Diagnosis Class Abnormal Acquisition Device D3K Ventricular Rate 78 Atrial Rate 78 P-R Interval 144 QRS Duration 106 Q-T Interval 376 QTC Calculation(Bazett) 428 Calculated P Axis 72 Calculated R Axis 15 Calculated T Axis 66  Diagnosis Normal sinus rhythm Septal infarct , age undetermined Abnormal ECG No previous ECGs available                            HEAR Score History:  Mostly low risk features ECG: Normal Age: 51-64 yrs Risk Factors: 1 or 2 risk factors HEAR Score Total: 2         NIH Stroke Scale Score Level of Consciousness (1a.): Alert, keenly responsive LOC Questions (1b.): Answers both questions correctly LOC Commands (1c.): Performs both tasks correctly Best Gaze (2.): Normal Visual (3.): No visual loss Facial Palsy (4.): Normal symmetrical movements Motor Arm, Left (5a.): No drift Motor Arm, Right (5b.): No drift Motor Leg, Left (6a.): No drift Motor Leg, Right (6b.): No drift Limb Ataxia (7.): Absent Sensory (8.): Normal, no sensory loss Best Language (9.): No aphasia Dysarthria (10.): Normal Extinction and Inattention (11.) (Formerly Neglect): No abnormality NIH Total: 0                                               Pre-Sedation Procedures    Medical Decision Making Presented to the emergency department for chest pain.    Differential diagnosis include ACS/MI, pneumonia, musculoskeletal chest pain, anemia, electrolyte abnormality angina, PE, dissection  Cardiac work-up performed, my independent interpretation of EKG High-sensitivity troponins were reviewed by myself-patient's chest pain was greater than 3 hours and per [facility] health care guidelines he ruled out after 1 hour.  Patient's troponins were flat at 5 with a delta of 0.  Discharged , follow-up with PCP recommended.  Patient also had a negative CT head and CTA head neck on 11/10/2023 that showed no stenosis or occlusions.  Patient's workup included basic labs troponins magnesium that were grossly within normal limits.  D-dimer was also negative.  X-ray of chest showed no acute abnormalities.    I do feel the patient was anxious about the findings of the mild aortic aneurysm.  He stated that he just was unsure of what this meant.  I stated that based on the current guidelines recommendation is repeat imaging in 6 to 12 months.  Based on the  imaging from yesterday it is highly unlikely that the patient's aneurysm has became a dissection. I did discuss patient with my attending who recommended getting a CTA of the aorta due to the patient's chest pain and recent incidental finding.  This showed aneurysm of the aorta at 4 cm.    The patient had no focal neurologic deficits.  It was tender to palpation of his paraspinal cervical muscles.  For this I will give him a muscle relaxer and also instructed him to take some OTC pain medication.  I believe his headache is tension type in nature.  He also had recent imaging that was all negative for acute abnormalities that would need surgical intervention.  Since that imaging of the pain has not really changed in nature.  Patient was stable for discharge.  We discussed red flag symptoms to be aware and that experiencing these were immediate return to ED protocol.  I will have  the patient follow-up with his PCP within a week for reevaluation.  Amount and/or Complexity of Data Reviewed Labs: ordered. Radiology: ordered. ECG/medicine tests: ordered.  Risk OTC drugs. Prescription drug management.          Provider Communication  Discharge Medication List as of 01/09/2024 12:15 AM     START taking these medications   Details  lidocaine 4% patch 4 % Place one patch onto the skin daily for 14 days. Remove & Discard patch within 12 hours or as directed by MD, Starting Thu 01/08/2024, Until Thu 01/22/2024, Normal    methocarbamol  (ROBAXIN ) 500 mg tablet Take one tablet (500 mg dose) by mouth 2 (two) times daily for 10 days., Starting Thu 01/08/2024, Until Sun 01/18/2024, Normal        Discharge Medication List as of 01/09/2024 12:15 AM      Discharge Medication List as of 01/09/2024 12:15 AM      Clinical Impression Final diagnoses:  Chest pain, unspecified type    ED Disposition     ED Disposition  Discharge   Condition  Stable   Comment  --                  Follow-up Information     Megan Dimes, MD.   Specialty: Family Medicine Contact information: [address] [place] [address] [phone]                  Electronically signed by:         [1] Social History Tobacco Use  Smoking Status Former  Smokeless Tobacco Not on file  [2] Allergies Allergen Reactions  . Nutmeg Oil (Myristica Oil) Swelling  . Histamine Swelling   Norman Osgood, PA-C 01/09/24 [phone]

## 2024-01-12 ENCOUNTER — Encounter: Payer: Self-pay | Admitting: Cardiovascular Disease

## 2024-01-12 ENCOUNTER — Ambulatory Visit: Attending: Cardiovascular Disease | Admitting: Cardiovascular Disease

## 2024-01-12 VITALS — BP 124/76 | HR 64 | Ht 71.0 in | Wt 198.6 lb

## 2024-01-12 DIAGNOSIS — I7121 Aneurysm of the ascending aorta, without rupture: Secondary | ICD-10-CM

## 2024-01-12 DIAGNOSIS — I10 Essential (primary) hypertension: Secondary | ICD-10-CM | POA: Diagnosis not present

## 2024-01-12 DIAGNOSIS — I712 Thoracic aortic aneurysm, without rupture, unspecified: Secondary | ICD-10-CM | POA: Insufficient documentation

## 2024-01-12 NOTE — Progress Notes (Signed)
 01/12/2024 Alm DELENA Herald   Dec 02, 1975  [id number]  Primary Physician [facility] Primary Cardiologist: Dorn JINNY Lesches MD [place], [place]  HPI:  Daniel Mata is a 48 y.o. fit appearing married Caucasian male father of 2 children he is accompanied by his wife Daniel Mata today.  He works in [employer].  He was referred by Dr. Zackary at [facility] for further evaluation of a mildly dilated ascending thoracic aorta.  His only risk factor includes treated hypertension.  There is no family history of heart disease.  He smoked remotely 18 years ago.  He drinks 1-2 beers a day.  His most recent lipid profile performed 08/14/2023 revealed total cholesterol 180, LDL 106 and HDL 64.  He is fairly active and plays golf and walks and is asymptomatic during these activities.  He recently had a coronary calcium score performed 01/09/2015 that was 0.  It did however show a mildly dilated ascending thoracic aorta at 42 mm with subsequent CTA revealing a thoracic aorta measuring 4 cm.  He has been experiencing some headache type symptoms which his primary care doctor is evaluating.   Current Meds  Medication Sig   cetirizine (ZYRTEC) 10 MG tablet Take 10 mg by mouth daily.   valsartan  (DIOVAN ) 80 MG tablet TAKE 1 TABLET (80 MG TOTAL) BY MOUTH DAILY. **NEEDS YEARLY PHYSICAL** (Patient taking differently: Take 40 mg by mouth daily. **NEEDS YEARLY PHYSICAL**)     Allergies  Allergen Reactions   Histamine Swelling    Related to spice that may have been in the crab boil or shrimp.   Nutmeg Oil (Myristica Oil) Swelling   Other Other (See Comments)    Any spicy foods or spicy ingredients-Causes migraines   Shrimp [Shellfish Allergy] Other (See Comments)    migraines    Social History   Socioeconomic History   Marital status: Married    Spouse name: Daniel Mata   Number of children: 2   Years of education: Not on file   Highest  education level: Not on file  Occupational History   Not on file  Tobacco Use   Smoking status: Former   Smokeless tobacco: Never  Vaping Use   Vaping status: Never Used  Substance and Sexual Activity   Alcohol use: Yes   Drug use: No   Sexual activity: Yes  Other Topics Concern   Not on file  Social History Narrative   Self employed   Two children - ages 50 and 6   Social Drivers of Corporate investment banker Strain: Not on file  Food Insecurity: Not on file  Transportation Needs: Not on file  Physical Activity: Not on file  Stress: Not on file  Social Connections: Not on file  Intimate Partner Violence: Not At Risk (01/08/2024)   Received from [facility]   HITS    Over the last 12 months how often did your partner physically hurt you?: Never    Over the last 12 months how often did your partner insult you or talk down to you?: Never    Over the last 12 months how often did your partner threaten you with physical harm?: Never    Over the last 12 months how often did your partner scream or curse at you?: Never     Review of Systems: General: negative for chills, fever, night sweats or weight changes.  Cardiovascular: negative for chest pain, dyspnea on exertion, edema, orthopnea, palpitations,  paroxysmal nocturnal dyspnea or shortness of breath Dermatological: negative for rash Respiratory: negative for cough or wheezing Urologic: negative for hematuria Abdominal: negative for nausea, vomiting, diarrhea, bright red blood per rectum, melena, or hematemesis Neurologic: negative for visual changes, syncope, or dizziness All other systems reviewed and are otherwise negative except as noted above.    Blood pressure 124/76, pulse 64, height 5' 11 (1.803 m), weight 198 lb 9.6 oz (90.1 kg), SpO2 98%.  General appearance: alert and no distress Neck: no adenopathy, no carotid bruit, no JVD, supple, symmetrical, trachea midline, and thyroid  not enlarged, symmetric, no  tenderness/mass/nodules Lungs: clear to auscultation bilaterally Heart: regular rate and rhythm, S1, S2 normal, no murmur, click, rub or gallop Extremities: extremities normal, atraumatic, no cyanosis or edema Pulses: 2+ and symmetric Skin: Skin color, texture, turgor normal. No rashes or lesions Neurologic: Grossly normal  EKG not performed today      ASSESSMENT AND PLAN:   Hypertension History of essential hypertension with blood pressure measured today at 124/76.  He is on valsartan .  Thoracic aortic aneurysm Recent CTA performed at [facility] 01/08/2024 revealed an minimally dilated ascending thoracic aorta measuring 40 mm.  I am going to get a 2D echo to correlate and we will follow this serially on an annual basis.     Dorn DOROTHA Lesches MD FACP,FACC,FAHA, [facility] 01/12/2024 9:58 AM

## 2024-01-12 NOTE — Patient Instructions (Signed)
 Medication Instructions:  Your physician recommends that you continue on your current medications as directed. Please refer to the Current Medication list given to you today.  *If you need a refill on your cardiac medications before your next appointment, please call your pharmacy*   Testing/Procedures: Your physician has requested that you have an echocardiogram. Echocardiography is a painless test that uses sound waves to create images of your heart. It provides your doctor with information about the size and shape of your heart and how well your heart's chambers and valves are working. This procedure takes approximately one hour. There are no restrictions for this procedure. Please do NOT wear cologne, perfume, aftershave, or lotions (deodorant is allowed). Please arrive 15 minutes prior to your appointment time.  Please note: We ask at that you not bring children with you during ultrasound (echo/ vascular) testing. Due to room size and safety concerns, children are not allowed in the ultrasound rooms during exams. Our front office staff cannot provide observation of children in our lobby area while testing is being conducted. An adult accompanying a patient to their appointment will only be allowed in the ultrasound room at the discretion of the ultrasound technician under special circumstances. We apologize for any inconvenience.   Follow-Up: At [facility], you and your health needs are our priority.  As part of our continuing mission to provide you with exceptional heart care, our providers are all part of one team.  This team includes your primary Cardiologist (physician) and Advanced Practice Providers or APPs (Physician Assistants and Nurse Practitioners) who all work together to provide you with the care you need, when you need it.  Your next appointment:   12 month(s)  Provider:   Dorn Lesches, MD

## 2024-01-12 NOTE — Assessment & Plan Note (Signed)
 Recent CTA performed at [facility] 01/08/2024 revealed an minimally dilated ascending thoracic aorta measuring 40 mm.  I am going to get a 2D echo to correlate and we will follow this serially on an annual basis.

## 2024-01-12 NOTE — Assessment & Plan Note (Signed)
 History of essential hypertension with blood pressure measured today at 124/76.  He is on valsartan .

## 2024-01-15 ENCOUNTER — Ambulatory Visit (HOSPITAL_COMMUNITY)
Admission: RE | Admit: 2024-01-15 | Discharge: 2024-01-15 | Disposition: A | Discharge status: Home / Self Care | Source: Ambulatory Visit | Attending: Cardiovascular Disease | Admitting: Cardiovascular Disease

## 2024-01-15 ENCOUNTER — Ambulatory Visit: Payer: Self-pay | Admitting: Cardiovascular Disease

## 2024-01-15 DIAGNOSIS — I7121 Aneurysm of the ascending aorta, without rupture: Secondary | ICD-10-CM | POA: Insufficient documentation

## 2024-01-15 DIAGNOSIS — I10 Essential (primary) hypertension: Secondary | ICD-10-CM | POA: Insufficient documentation

## 2024-01-15 LAB — ECHOCARDIOGRAM COMPLETE
Area-P 1/2: 3.74 cm2
S' Lateral: 3.2 cm

## 2024-02-04 ENCOUNTER — Other Ambulatory Visit (HOSPITAL_COMMUNITY)

## 2024-02-05 ENCOUNTER — Encounter: Payer: Self-pay | Admitting: Cardiovascular Disease
